# Patient Record
Sex: Male | Born: 1975
Health system: Southern US, Community
[De-identification: ages and names within clinical notes are randomized; demographics above are authoritative.]

## PROBLEM LIST (undated history)

## (undated) DIAGNOSIS — N200 Calculus of kidney: Secondary | ICD-10-CM

## (undated) HISTORY — PX: WISDOM TOOTH EXTRACTION: SHX21

## (undated) HISTORY — DX: Calculus of kidney: N20.0

## (undated) HISTORY — PX: ANKLE FRACTURE SURGERY: SHX122

## (undated) HISTORY — PX: MOUTH SURGERY: SHX715

---

## 2003-07-13 ENCOUNTER — Emergency Department (HOSPITAL_COMMUNITY): Admission: EM | Admit: 2003-07-13 | Discharge: 2003-07-13 | Payer: Self-pay | Admitting: Family Medicine

## 2003-11-15 ENCOUNTER — Encounter: Admission: RE | Admit: 2003-11-15 | Discharge: 2003-11-15 | Payer: Self-pay | Admitting: Gastroenterology

## 2003-12-16 ENCOUNTER — Emergency Department (HOSPITAL_COMMUNITY): Admission: EM | Admit: 2003-12-16 | Discharge: 2003-12-16 | Payer: Self-pay | Admitting: Family Medicine

## 2009-11-23 ENCOUNTER — Encounter: Admission: RE | Admit: 2009-11-23 | Discharge: 2009-11-23 | Payer: Self-pay | Admitting: Internal Medicine

## 2010-04-16 ENCOUNTER — Encounter: Payer: Self-pay | Admitting: Internal Medicine

## 2010-05-23 ENCOUNTER — Ambulatory Visit: Payer: 59 | Attending: Internal Medicine

## 2010-05-26 ENCOUNTER — Ambulatory Visit: Payer: 59 | Attending: Internal Medicine

## 2010-05-26 DIAGNOSIS — IMO0001 Reserved for inherently not codable concepts without codable children: Secondary | ICD-10-CM | POA: Insufficient documentation

## 2010-05-26 DIAGNOSIS — M542 Cervicalgia: Secondary | ICD-10-CM | POA: Insufficient documentation

## 2010-06-01 ENCOUNTER — Ambulatory Visit: Payer: 59

## 2010-06-02 ENCOUNTER — Ambulatory Visit: Payer: 59

## 2010-06-05 ENCOUNTER — Ambulatory Visit: Payer: 59

## 2010-06-07 ENCOUNTER — Ambulatory Visit: Payer: 59

## 2015-01-13 ENCOUNTER — Ambulatory Visit (INDEPENDENT_AMBULATORY_CARE_PROVIDER_SITE_OTHER): Payer: 59 | Admitting: Physician Assistant

## 2015-01-13 VITALS — BP 110/80 | HR 101 | Temp 98.4°F | Resp 18 | Ht 69.0 in | Wt 178.0 lb

## 2015-01-13 DIAGNOSIS — R062 Wheezing: Secondary | ICD-10-CM

## 2015-01-13 DIAGNOSIS — J019 Acute sinusitis, unspecified: Secondary | ICD-10-CM | POA: Diagnosis not present

## 2015-01-13 DIAGNOSIS — R059 Cough, unspecified: Secondary | ICD-10-CM

## 2015-01-13 DIAGNOSIS — R05 Cough: Secondary | ICD-10-CM | POA: Diagnosis not present

## 2015-01-13 MED ORDER — ALBUTEROL SULFATE (2.5 MG/3ML) 0.083% IN NEBU
2.5000 mg | INHALATION_SOLUTION | Freq: Once | RESPIRATORY_TRACT | Status: AC
  Administered 2015-01-13: 2.5 mg via RESPIRATORY_TRACT

## 2015-01-13 MED ORDER — PREDNISONE 20 MG PO TABS: ORAL_TABLET | ORAL | Status: DC

## 2015-01-13 MED ORDER — IPRATROPIUM BROMIDE 0.03 % NA SOLN: 2.0000 | Freq: Two times a day (BID) | NASAL | Status: DC

## 2015-01-13 MED ORDER — AMOXICILLIN-POT CLAVULANATE 875-125 MG PO TABS: 1.0000 | ORAL_TABLET | Freq: Two times a day (BID) | ORAL | Status: AC

## 2015-01-13 MED ORDER — IPRATROPIUM BROMIDE 0.02 % IN SOLN
0.5000 mg | Freq: Once | RESPIRATORY_TRACT | Status: AC
  Administered 2015-01-13: 0.5 mg via RESPIRATORY_TRACT

## 2015-01-13 MED ORDER — GUAIFENESIN ER 1200 MG PO TB12: 1.0000 | ORAL_TABLET | Freq: Two times a day (BID) | ORAL | Status: DC | PRN

## 2015-01-13 NOTE — Patient Instructions (Signed)
Get plenty of rest and drink at least 64 ounces of water daily. 

## 2015-01-13 NOTE — Progress Notes (Signed)
Subjective:   Patient ID: Jerry Rowland, male     DOB: 03-20-1976, 39 y.o.    MRN: [id number]  PCP: No primary care provider on file.  Chief Complaint  Patient presents with  . URI    cough, runny nose. productive cough with green phlegm. No chills or fever    HPI  Presents for evaluation of productive cough, congestion, and ear fullness x 3 weeks.   Every time patient takes a deep breath, feels like he needs to cough. Cough associated with green-yellow mucous, but no blood. Feels generally week, trouble sleeping at night d/t congestion and gets SOB easily. Wife and kids got sick about a month ago with similar symptoms, but no other recent sick contacts.   Received flu shot approx. 2-3 weeks ago at work.    Prior to Admission medications   Not on File     Allergies  Allergen Reactions  . Codeine Itching     There are no active problems to display for this patient.    History reviewed. No pertinent family history.   Social History   Social History  . Marital Status: Married    Spouse Name: N/A  . Number of Children: N/A  . Years of Education: N/A   Occupational History  . IT     [employer]   Social History Main Topics  . Smoking status: Never Smoker   . Smokeless tobacco: Not on file  . Alcohol Use: 0.0 oz/week    0 Standard drinks or equivalent per week  . Drug Use: No  . Sexual Activity: Not on file   Other Topics Concern  . Not on file   Social History Narrative   Lives with his wife and children.        Review of Systems Constitutional: Negative for fever, chills, activity change, appetite change and fatigue.  HENT: Positive for congestion, ear pain (fullness), postnasal drip, rhinorrhea (occasional) and sore throat. Negative for hearing loss.  Eyes: Negative.  Respiratory: Positive for cough, chest tightness, shortness of breath and wheezing.  Cardiovascular: Negative for chest pain and palpitations.  Gastrointestinal: Negative.    Musculoskeletal: Negative.  Allergic/Immunologic: Negative for environmental allergies.  Neurological: Positive for headaches (related to coughing). Negative for dizziness and light-headedness.        Objective:  Physical Exam  Constitutional: He is oriented to person, place, and time. Vital signs are normal. He appears well-developed and well-nourished. No distress.  BP 110/80 mmHg  Pulse 101  Temp(Src) 98.4 F (36.9 C) (Oral)  Resp 18  Ht  (1.753 m)  Wt 178 lb (80.74 kg)  BMI 26.27 kg/m2  SpO2 97%   HENT:  Head: Normocephalic and atraumatic.  Right Ear: Hearing, tympanic membrane, external ear and ear canal normal.  Left Ear: Hearing, tympanic membrane, external ear and ear canal normal.  Nose: Mucosal edema and rhinorrhea present.  No foreign bodies. Right sinus exhibits no maxillary sinus tenderness and no frontal sinus tenderness. Left sinus exhibits no maxillary sinus tenderness and no frontal sinus tenderness.  Mouth/Throat: Uvula is midline, oropharynx is clear and moist and mucous membranes are normal. No uvula swelling. No oropharyngeal exudate.  Eyes: Conjunctivae and EOM are normal. Pupils are equal, round, and reactive to light. Right eye exhibits no discharge. Left eye exhibits no discharge. No scleral icterus.  Neck: Trachea normal, normal range of motion and full passive range of motion without pain. Neck supple. No thyroid mass and no thyromegaly present.  Cardiovascular: Normal rate, regular rhythm and normal heart sounds.   Pulmonary/Chest: Effort normal. No respiratory distress. He has wheezes (coarse). He has no rales. He exhibits no tenderness.  Lymphadenopathy:       Head (right side): No submandibular, no tonsillar, no preauricular, no posterior auricular and no occipital adenopathy present.       Head (left side): No submandibular, no tonsillar, no preauricular and no occipital adenopathy present.    He has no cervical adenopathy.       Right: No  supraclavicular adenopathy present.       Left: No supraclavicular adenopathy present.  Neurological: He is alert and oriented to person, place, and time. He has normal strength. No cranial nerve deficit or sensory deficit.  Skin: Skin is warm, dry and intact. No rash noted.  Psychiatric: He has a normal mood and affect. His speech is normal and behavior is normal.    Albuterol + Atrovent neb treatment afforded minimal improvement in his symptoms, but did cause HA and jitteriness. Wheezing after neb was minimally reduced, but no longer coarse, very high-pitched and musical.         Assessment & Plan:  1. Cough 2. Wheezing Likely due to drainage from sinusitis. Prednisone. See below. - albuterol (PROVENTIL) (2.5 MG/3ML) 0.083% nebulizer solution 2.5 mg; Take 3 mLs (2.5 mg total) by nebulization once. - ipratropium (ATROVENT) nebulizer solution 0.5 mg; Take 2.5 mLs (0.5 mg total) by nebulization once. - predniSONE (DELTASONE) 20 MG tablet; Take 3 PO QAM x3days, 2 PO QAM x3days, 1 PO QAM x3days  Dispense: 18 tablet; Refill: 0  3. Subacute sinusitis, unspecified location Likely secondary to initial viral URI. Supportive care. Anticipatory guidance. Follow-up PRN. - amoxicillin-clavulanate (AUGMENTIN) 875-125 MG tablet; Take 1 tablet by mouth 2 (two) times daily.  Dispense: 20 tablet; Refill: 0 - ipratropium (ATROVENT) 0.03 % nasal spray; Place 2 sprays into both nostrils 2 (two) times daily.  Dispense: 30 mL; Refill: 0 - Guaifenesin (MUCINEX MAXIMUM STRENGTH) 1200 MG TB12; Take 1 tablet (1,200 mg total) by mouth every 12 (twelve) hours as needed.  Dispense: 14 tablet; Refill: 1   Fernande Brashelle S. Helen Cuff, PA-C Physician Assistant-Certified [facility]

## 2015-01-13 NOTE — Progress Notes (Signed)
Subjective:    Patient ID: Jerry Rowland, male    DOB: 1976-02-05, 39 y.o.   MRN: [id number]  Chief Complaint  Patient presents with  . URI    cough, runny nose. productive cough with green phlegm. No chills or fever   HPI Patient presents today for evaluation of productive cough, congestion, and ear fullness x 3 weeks. Every time patient takes a deep breath, feels like he needs to cough. Cough associated with green-yellow mucous, but no blood. Feels generally week, trouble sleeping at night d/t congestion and gets SOB easily. Wife and kids got sick about a month ago with similar symptoms, but no other recent sick contacts. Received flu shot approx. 2-3 weeks ago at work.   Review of Systems  Constitutional: Negative for fever, chills, activity change, appetite change and fatigue.  HENT: Positive for congestion, ear pain (fullness), postnasal drip, rhinorrhea (occasional) and sore throat. Negative for hearing loss.   Eyes: Negative.   Respiratory: Positive for cough, chest tightness, shortness of breath and wheezing.   Cardiovascular: Negative for chest pain and palpitations.  Gastrointestinal: Negative.   Musculoskeletal: Negative.   Allergic/Immunologic: Negative for environmental allergies.  Neurological: Positive for headaches (related to coughing). Negative for dizziness and light-headedness.   History reviewed. No pertinent family history.   Social History   Social History  . Marital Status: Married    Spouse Name: N/A  . Number of Children: N/A  . Years of Education: N/A   Occupational History  . Not on file.   Social History Main Topics  . Smoking status: Never Smoker   . Smokeless tobacco: Not on file  . Alcohol Use: 0.0 oz/week    0 Standard drinks or equivalent per week  . Drug Use: No  . Sexual Activity: Not on file   Other Topics Concern  . Not on file   Social History Narrative  . No narrative on file   No medications on file.   Allergies    Allergen Reactions  . Codeine Itching      Objective:   Physical Exam  Constitutional: He appears well-developed and well-nourished. He appears distressed (Patient standing up and pacing room, uncomfortable).  BP 110/80 mmHg  Pulse 101  Temp(Src) 98.4 F (36.9 C) (Oral)  Resp 18  Ht  (1.753 m)  Wt 178 lb (80.74 kg)  BMI 26.27 kg/m2  SpO2 97%  HENT:  Head: Normocephalic and atraumatic.  Eyes: Conjunctivae and EOM are normal. No scleral icterus.  Neck: Neck supple. No JVD present.  Cardiovascular: Normal rate, regular rhythm, normal heart sounds and intact distal pulses.  Exam reveals no gallop and no friction rub.   No murmur heard. Pulmonary/Chest: He has wheezes (coarse). He has no rales. He exhibits no tenderness.  Wet cough whenever he takes a deep breath  Lymphadenopathy:    He has no cervical adenopathy.   Post-nebulizer treatment (albuterol + ipratropium), repeated patient's lung exam, which revealed high-pitched, musical wheezes. Patient reports minimal improvement of symptoms with increased jitteriness and HA consistent with nebulizer treatment.      Assessment & Plan:  1. Cough - Likely d/t sinus etiology. Recommend Mucinex to thin secretions for easier clearing of mucous and Atrovent nasal spray to alleviate lung twitching, which is exacerbating patient's cough.    2. Wheezing - Likely d/t upper respiratory drainage. Do not suspect infection in the lungs. Offered patient nebulizer equipment for breathing treatments at home, however patient declined. States his wife has  one at home.  - albuterol (PROVENTIL) (2.5 MG/3ML) 0.083% nebulizer solution 2.5 mg; Take 3 mLs (2.5 mg total) by nebulization once. - ipratropium (ATROVENT) nebulizer solution 0.5 mg; Take 2.5 mLs (0.5 mg total) by nebulization once. - predniSONE (DELTASONE) 20 MG tablet; Take 3 PO QAM x3days, 2 PO QAM x3days, 1 PO QAM x3days  Dispense: 18 tablet; Refill: 0  3. Subacute sinusitis, unspecified  location - Given duration of patient's symptoms with associated productive cough and wheezing without consolidation, suspect sinus etiology. Encourage plenty of fluids and lots of rest while completing antibiotic therapy.  - amoxicillin-clavulanate (AUGMENTIN) 875-125 MG tablet; Take 1 tablet by mouth 2 (two) times daily.  Dispense: 20 tablet; Refill: 0 - ipratropium (ATROVENT) 0.03 % nasal spray; Place 2 sprays into both nostrils 2 (two) times daily.  Dispense: 30 mL; Refill: 0 - Guaifenesin (MUCINEX MAXIMUM STRENGTH) 1200 MG TB12; Take 1 tablet (1,200 mg total) by mouth every 12 (twelve) hours as needed.  Dispense: 14 tablet; Refill: 1

## 2015-11-28 ENCOUNTER — Emergency Department: Payer: 59

## 2015-11-28 ENCOUNTER — Emergency Department
Admission: EM | Admit: 2015-11-28 | Discharge: 2015-11-28 | Disposition: A | Discharge status: Home / Self Care | Payer: 59 | Attending: Emergency Medicine | Admitting: Emergency Medicine

## 2015-11-28 ENCOUNTER — Encounter: Payer: Self-pay | Admitting: *Deleted

## 2015-11-28 DIAGNOSIS — R1030 Lower abdominal pain, unspecified: Secondary | ICD-10-CM | POA: Diagnosis not present

## 2015-11-28 DIAGNOSIS — N132 Hydronephrosis with renal and ureteral calculous obstruction: Secondary | ICD-10-CM | POA: Diagnosis not present

## 2015-11-28 DIAGNOSIS — N200 Calculus of kidney: Secondary | ICD-10-CM | POA: Insufficient documentation

## 2015-11-28 DIAGNOSIS — R109 Unspecified abdominal pain: Secondary | ICD-10-CM

## 2015-11-28 DIAGNOSIS — N1330 Unspecified hydronephrosis: Secondary | ICD-10-CM | POA: Diagnosis not present

## 2015-11-28 DIAGNOSIS — R1031 Right lower quadrant pain: Secondary | ICD-10-CM | POA: Diagnosis present

## 2015-11-28 LAB — BASIC METABOLIC PANEL
Anion gap: 10 (ref 5–15)
BUN: 16 mg/dL (ref 6–20)
CALCIUM: 9.5 mg/dL (ref 8.9–10.3)
CO2: 25 mmol/L (ref 22–32)
CREATININE: 1.2 mg/dL (ref 0.61–1.24)
Chloride: 106 mmol/L (ref 101–111)
GFR calc non Af Amer: >60 mL/min (ref >60)
Glucose, Bld: 122 mg/dL — ABNORMAL HIGH (ref 65–99)
Potassium: 3.2 mmol/L — ABNORMAL LOW (ref 3.5–5.1)
Sodium: 141 mmol/L (ref 135–145)

## 2015-11-28 LAB — CBC WITH DIFFERENTIAL/PLATELET
BASOS PCT: 1 %
Basophils Absolute: 0.1 10*3/uL (ref 0–0.1)
EOS ABS: 0.1 10*3/uL (ref 0–0.7)
Eosinophils Relative: 1 %
HEMATOCRIT: 42.5 % (ref 40.0–52.0)
Hemoglobin: 15 g/dL (ref 13.0–18.0)
Lymphocytes Relative: 32 %
Lymphs Abs: 3.1 10*3/uL (ref 1.0–3.6)
MCH: 30.3 pg (ref 26.0–34.0)
MCHC: 35.3 g/dL (ref 32.0–36.0)
MCV: 85.6 fL (ref 80.0–100.0)
MONO ABS: 0.8 10*3/uL (ref 0.2–1.0)
MONOS PCT: 8 %
NEUTROS ABS: 5.6 10*3/uL (ref 1.4–6.5)
Neutrophils Relative %: 58 %
Platelets: 290 10*3/uL (ref 150–440)
RBC: 4.96 MIL/uL (ref 4.40–5.90)
RDW: 12.9 % (ref 11.5–14.5)
WBC: 9.7 10*3/uL (ref 3.8–10.6)

## 2015-11-28 LAB — URINALYSIS COMPLETE WITH MICROSCOPIC (ARMC ONLY)
BACTERIA UA: NONE SEEN
Bilirubin Urine: NEGATIVE
Glucose, UA: NEGATIVE mg/dL
Hgb urine dipstick: NEGATIVE
KETONES UR: NEGATIVE mg/dL
Leukocytes, UA: NEGATIVE
NITRITE: NEGATIVE
PH: 9 — AB (ref 5.0–8.0)
PROTEIN: 30 mg/dL — AB
Specific Gravity, Urine: 1.017 (ref 1.005–1.030)

## 2015-11-28 MED ORDER — SODIUM CHLORIDE 0.9 % IV BOLUS (SEPSIS): 1000.0000 mL | Freq: Once | INTRAVENOUS | Status: DC

## 2015-11-28 MED ORDER — TAMSULOSIN HCL 0.4 MG PO CAPS: 0.4000 mg | ORAL_CAPSULE | Freq: Every day | ORAL | 0 refills | Status: DC

## 2015-11-28 MED ORDER — DEXTROSE 5 % IV SOLN: 1.0000 g | Freq: Once | INTRAVENOUS | Status: DC

## 2015-11-28 MED ORDER — HYDROMORPHONE HCL 1 MG/ML IJ SOLN
1.0000 mg | Freq: Once | INTRAMUSCULAR | Status: AC
  Administered 2015-11-28: 1 mg via INTRAVENOUS

## 2015-11-28 MED ORDER — HYDROMORPHONE HCL 1 MG/ML IJ SOLN
INTRAMUSCULAR | Status: AC
  Administered 2015-11-28: 1 mg via INTRAVENOUS
  Filled 2015-11-28: qty 1

## 2015-11-28 MED ORDER — KETOROLAC TROMETHAMINE 30 MG/ML IJ SOLN
INTRAMUSCULAR | Status: AC
  Administered 2015-11-28: 30 mg via INTRAVENOUS
  Filled 2015-11-28: qty 1

## 2015-11-28 MED ORDER — KETOROLAC TROMETHAMINE 30 MG/ML IJ SOLN
30.0000 mg | Freq: Once | INTRAMUSCULAR | Status: AC
  Administered 2015-11-28: 30 mg via INTRAVENOUS

## 2015-11-28 MED ORDER — OXYCODONE-ACETAMINOPHEN 5-325 MG PO TABS: 1.0000 | ORAL_TABLET | Freq: Four times a day (QID) | ORAL | 0 refills | Status: DC | PRN

## 2015-11-28 MED ORDER — SODIUM CHLORIDE 0.9 % IV BOLUS (SEPSIS): 500.0000 mL | Freq: Once | INTRAVENOUS | Status: DC

## 2015-11-28 MED ORDER — SODIUM CHLORIDE 0.9 % IV BOLUS (SEPSIS)
1000.0000 mL | Freq: Once | INTRAVENOUS | Status: AC
  Administered 2015-11-28: 1000 mL via INTRAVENOUS

## 2015-11-28 MED ORDER — TAMSULOSIN HCL 0.4 MG PO CAPS
0.4000 mg | ORAL_CAPSULE | Freq: Once | ORAL | Status: AC
  Administered 2015-11-28: 0.4 mg via ORAL
  Filled 2015-11-28: qty 1

## 2015-11-28 NOTE — ED Triage Notes (Signed)
States right sided flank pain, pt unable to sit still, states nausea

## 2015-11-28 NOTE — ED Provider Notes (Signed)
[facility] Emergency Department Provider Note   ____________________________________________   First MD Initiated Contact with Patient 11/28/15 1826     (approximate)  I have reviewed the triage vital signs and the nursing notes.   HISTORY  Chief Complaint Flank Pain  HPI Jerry Rowland is a 40 y.o. male without any chronic medical conditions was presenting to the emergency department with sudden onset right lower flank pain that started 1 hour prior to arrival. He says the pain is severe at a 10 out of 10 and sharp. He says that feels like something is squeezing in his right lower flank and radiating down into his testicles. He has urinated and said it did not hurt to urinate. No history of kidney stones. No family history of kidney stones. He denies any nausea or vomiting.   History reviewed. No pertinent past medical history.  There are no active problems to display for this patient.   History reviewed. No pertinent surgical history.  Prior to Admission medications   Medication Sig Start Date End Date Taking? Authorizing Provider  Guaifenesin (MUCINEX MAXIMUM STRENGTH) 1200 MG TB12 Take 1 tablet (1,200 mg total) by mouth every 12 (twelve) hours as needed. 01/13/15   Chelle Jeffery, PA-C  ipratropium (ATROVENT) 0.03 % nasal spray Place 2 sprays into both nostrils 2 (two) times daily. 01/13/15   Chelle Jeffery, PA-C  predniSONE (DELTASONE) 20 MG tablet Take 3 PO QAM x3days, 2 PO QAM x3days, 1 PO QAM x3days 01/13/15   Chelle Jeffery, PA-C    Allergies Codeine  History reviewed. No pertinent family history.  Social History Social History  Substance Use Topics  . Smoking status: Never Smoker  . Smokeless tobacco: Not on file  . Alcohol use 0.0 oz/week    Review of Systems Constitutional: No fever/chills Eyes: No visual changes. ENT: No sore throat. Cardiovascular: Denies chest pain. Respiratory: Denies shortness of  breath. Gastrointestinal: Right lower abdominal pain.  No nausea, no vomiting.  No diarrhea.  No constipation. Genitourinary: Negative for dysuria. Musculoskeletal: As above Skin: Negative for rash. Neurological: Negative for headaches, focal weakness or numbness.  10-point ROS otherwise negative.  ____________________________________________   PHYSICAL EXAM:  VITAL SIGNS: ED Triage Vitals  Enc Vitals Group     BP --      Pulse Rate 11/28/15 1825 (!) 103     Resp --      Temp --      Temp src --      SpO2 11/28/15 1825 100 %     Weight 11/28/15 1811 179 lb (81.2 kg)     Height 11/28/15 1811 5\' 9"  (1.753 m)     Head Circumference --      Peak Flow --      Pain Score 11/28/15 1817 10     Pain Loc --      Pain Edu? --      Excl. in GC? --     Constitutional: Alert and oriented. Patient rolling on bed failing to find a position of comfort. Eyes: Conjunctivae are normal. PERRL. EOMI. Head: Atraumatic. Nose: No congestion/rhinnorhea. Mouth/Throat: Mucous membranes are moist.   Neck: No stridor.   Cardiovascular: Tachycardic, regular rhythm. Grossly normal heart sounds.   Respiratory: Normal respiratory effort.  No retractions. Lungs CTAB. Gastrointestinal: Soft and with mild right lower quadrant tenderness. No rebound or guarding. No distention.  Mild right-sided CVA tenderness  Genitourinary:  Normal external genitalia without any masses. No testicular tenderness nor swelling.  Musculoskeletal: No lower extremity tenderness nor edema.  No joint effusions. Neurologic:  Normal speech and language. No gross focal neurologic deficits are appreciated.  Skin:  Skin is warm, dry and intact. No rash noted. Psychiatric: Mood and affect are normal. Speech and behavior are normal.  ____________________________________________   LABS (all labs ordered are listed, but only abnormal results are displayed)  Labs Reviewed  BASIC METABOLIC PANEL - Abnormal; Notable for the following:        Result Value   Potassium 3.2 (*)    Glucose, Bld 122 (*)    All other components within normal limits  URINALYSIS COMPLETEWITH MICROSCOPIC ([facility] ONLY) - Abnormal; Notable for the following:    Color, Urine YELLOW (*)    APPearance CLOUDY (*)    pH 9.0 (*)    Protein, ur 30 (*)    Squamous Epithelial / LPF 0-5 (*)    All other components within normal limits  CBC WITH DIFFERENTIAL/PLATELET   ____________________________________________  EKG   ____________________________________________  RADIOLOGY  CT RENAL STONE STUDY (Accession [id number]) (Order [id number])  Imaging  Date: 11/28/2015 Department: [facility] EMERGENCY DEPARTMENT Released By/Authorizing: Myrna Blazer, MD (auto-released)  PACS Images   Show images for CT RENAL STONE STUDY  Study Result   CLINICAL DATA:  Right groin pain radiating into the right flank 1:30 p.m. today.  EXAM: CT ABDOMEN AND PELVIS WITHOUT CONTRAST  TECHNIQUE: Multidetector CT imaging of the abdomen and pelvis was performed following the standard protocol without IV contrast.  COMPARISON:  CT abdomen and pelvis 11/23/2009.  FINDINGS: No pleural or pericardial effusion. Heart size is normal. Lung bases are clear.  Mild to moderate right hydronephrosis due to a 0.3 cm stone at the UVJ. A punctate nonobstructing stone is identified in the lower pole of the right kidney. No urinary tract stones are seen. Urinary bladder, prostate gland and seminal vesicles appear normal.  The gallbladder, liver, spleen, adrenal glands and pancreas appear normal. The small and large bowel and appendix appear normal. No lymphadenopathy or fluid.  No focal bony abnormality.  IMPRESSION: Mild to moderate right hydronephrosis due to a 0.3 cm stone at the UVJ. Punctate nonobstructing stone lower pole right kidney also noted.   Electronically Signed   By: Drusilla Kanner M.D.   On: 11/28/2015 19:22     ____________________________________________   PROCEDURES  Procedure(s) performed:   Procedures  Critical Care performed:   ____________________________________________   INITIAL IMPRESSION / ASSESSMENT AND PLAN / ED COURSE  Pertinent labs & imaging results that were available during my care of the patient were reviewed by me and considered in my medical decision making (see chart for details).  History and physical exam consistent with kidney stone. The patient will be fluid hydrated as well as received Toradol. We will scan his abdomen as well as check blood work. He is understanding of the plan and willing to comply.  Clinical Course   ----------------------------------------- 7:46 PM on 11/28/2015 ----------------------------------------- Patient is pain-free at this time after Toradol and Dilaudid. He does not show any signs of infection in his urine. His blood work is reassuring as well with a normal white blood cell count as well as kidney function. His discussed his lab results as well as imaging. The patient will be discharged home on Flomax as well as Percocet for breakthrough pain. We also discussed return precautions including fever and abdominal pain. The patient is understanding of the plan and willing to comply. He'll be  given a strainer and also instructions for follow-up with urology.   ____________________________________________   FINAL CLINICAL IMPRESSION(S) / ED DIAGNOSES  Final diagnoses:  Right flank pain   Kidney stone.   NEW MEDICATIONS STARTED DURING THIS VISIT:  New Prescriptions   No medications on file     Note:  This document was prepared using Dragon voice recognition software and may include unintentional dictation errors.    Myrna Blazer, MD 11/28/15 [phone]

## 2015-12-13 ENCOUNTER — Encounter: Payer: Self-pay | Admitting: Urology

## 2015-12-13 ENCOUNTER — Ambulatory Visit (INDEPENDENT_AMBULATORY_CARE_PROVIDER_SITE_OTHER): Payer: 59 | Admitting: Urology

## 2015-12-13 VITALS — BP 147/90 | HR 100 | Ht 69.0 in | Wt 184.0 lb

## 2015-12-13 DIAGNOSIS — N200 Calculus of kidney: Secondary | ICD-10-CM | POA: Diagnosis not present

## 2015-12-13 LAB — URINALYSIS, COMPLETE
BILIRUBIN UA: NEGATIVE
GLUCOSE, UA: NEGATIVE
KETONES UA: NEGATIVE
Leukocytes, UA: NEGATIVE
NITRITE UA: NEGATIVE
Protein, UA: NEGATIVE
RBC UA: NEGATIVE
SPEC GRAV UA: 1.025 (ref 1.005–1.030)
Urobilinogen, Ur: 0.2 mg/dL (ref 0.2–1.0)
pH, UA: 5.5 (ref 5.0–7.5)

## 2015-12-13 LAB — MICROSCOPIC EXAMINATION: Bacteria, UA: NONE SEEN

## 2015-12-13 NOTE — Progress Notes (Signed)
12/13/2015 5:07 PM   Jerry Rowland [birth year] [id number]  Referring provider: No referring provider defined for this encounter.  Chief Complaint  Patient presents with  . Nephrolithiasis    New Patient    HPI: 40 year old male seen in follow-up from the emergency room where he presented  on 9/4 with acute onset right-sided renal colic pain. In the emergency room he was found to be nontoxic with no evidence of infection. A urinalysis demonstrated microscopic hematuria. A CT scan was performed demonstrating a 3 mm stone at the right UVJ. The patient was subsequently discharged on medical expulsion therapy. Since the patient was discharged from the emergency department he has passed a stone which he brought with him today. He denies any ongoing flank pain. He denies any fevers/chills or voiding symptoms. The patient has passed this stone as a child, he has not had any stones as an adult. The patient mentions that it is that he drinks a fair amount of soda/tea. He also has an excessive amount of protein.      PMH: Past Medical History:  Diagnosis Date  . Nephrolithiasis     Surgical History: Past Surgical History:  Procedure Laterality Date  . ANKLE FRACTURE SURGERY Right     Home Medications:    Medication List       Accurate as of 12/13/15  5:07 PM. Always use your most recent med list.          oxyCODONE-acetaminophen 5-325 MG tablet Commonly known as:  ROXICET Take 1-2 tablets by mouth every 6 (six) hours as needed.       Allergies:  Allergies  Allergen Reactions  . Codeine Itching    Family History: Family History  Problem Relation Age of Onset  . Prostate cancer Neg Hx   . Bladder Cancer Neg Hx   . Kidney cancer Neg Hx     Social History:  reports that he has never smoked. He has never used smokeless tobacco. He reports that he drinks alcohol. He reports that he does not use drugs.  ROS: UROLOGY Frequent Urination?: No Hard to postpone  urination?: No Burning/pain with urination?: No Get up at night to urinate?: No Leakage of urine?: No Urine stream starts and stops?: No Trouble starting stream?: No Do you have to strain to urinate?: No Blood in urine?: No Urinary tract infection?: No Sexually transmitted disease?: No Injury to kidneys or bladder?: No Painful intercourse?: No Weak stream?: No Erection problems?: No Penile pain?: No  Gastrointestinal Nausea?: No Vomiting?: No Indigestion/heartburn?: No Diarrhea?: No Constipation?: No  Constitutional Fever: No Night sweats?: No Weight loss?: No Fatigue?: Yes  Skin Skin rash/lesions?: No Itching?: No  Eyes Blurred vision?: No Double vision?: No  Ears/Nose/Throat Sore throat?: No Sinus problems?: No  Hematologic/Lymphatic Swollen glands?: No Easy bruising?: No  Cardiovascular Leg swelling?: No Chest pain?: No  Respiratory Cough?: No Shortness of breath?: No  Endocrine Excessive thirst?: No  Musculoskeletal Back pain?: No Joint pain?: No  Neurological Headaches?: No Dizziness?: No  Psychologic Depression?: No Anxiety?: No  Physical Exam: BP (!) 147/90   Pulse 100   Ht 5\' 9"  (1.753 m)   Wt 83.5 kg (184 lb)   BMI 27.17 kg/m   Constitutional:  Alert and oriented, No acute distress. HEENT: [place] AT, moist mucus membranes.  Trachea midline, no masses. Cardiovascular: No clubbing, cyanosis, or edema. Respiratory: Normal respiratory effort, no increased work of breathing. GI: Abdomen is soft, nontender, nondistended, no abdominal masses GU:  No CVA tenderness.  Skin: No rashes, bruises or suspicious lesions. Lymph: No cervical or inguinal adenopathy. Neurologic: Grossly intact, no focal deficits, moving all 4 extremities. Psychiatric: Normal mood and affect.  Laboratory Data: Lab Results  Component Value Date   WBC 9.7 11/28/2015   HGB 15.0 11/28/2015   HCT 42.5 11/28/2015   MCV 85.6 11/28/2015   PLT 290 11/28/2015     Lab Results  Component Value Date   CREATININE 1.20 11/28/2015    No results found for: PSA  No results found for: TESTOSTERONE  No results found for: HGBA1C  Urinalysis    Component Value Date/Time   COLORURINE YELLOW (A) 11/28/2015 1819   APPEARANCEUR Clear 12/13/2015 1518   LABSPEC 1.017 11/28/2015 1819   PHURINE 9.0 (H) 11/28/2015 1819   GLUCOSEU Negative 12/13/2015 1518   HGBUR NEGATIVE 11/28/2015 1819   BILIRUBINUR Negative 12/13/2015 1518   KETONESUR NEGATIVE 11/28/2015 1819   PROTEINUR Negative 12/13/2015 1518   PROTEINUR 30 (A) 11/28/2015 1819   NITRITE Negative 12/13/2015 1518   NITRITE NEGATIVE 11/28/2015 1819   LEUKOCYTESUR Negative 12/13/2015 1518    Pertinent Imaging: I have independently reviewed the patient's CT scan from 11/28/15 demonstrating a small stone at the right UVJ with associated mild/moderate hydronephrosis. There is a punctate calcification in the right kidney is nonobstructive.  Assessment  & Plan:  40 year old male who was passed his obstructing kidney stone and is no longer symptomatic. They're nonobstructing stone in his right kidney is of little clinical significance this point for surveillance. I went over stone prevention strategies patient including increasing fluid intake/ear output. Also suggest that he start lemonade therapy. We also discussed the importance of low salt diet, avoiding vitamin C, and minimizing his protein intake to 1 serving sizes/day. The patient will follow-up with us on A when necessary basis  1. Nephrolithiasis  - Urinalysis, Complete   No Follow-up on file.  Crist FatHERRICK, [name], MD  [facility] [address], [address] [place], [place] [address] [phone]

## 2016-10-26 ENCOUNTER — Telehealth: Payer: Self-pay | Admitting: General Practice

## 2016-10-26 NOTE — Telephone Encounter (Signed)
If someone new comes in requesting a physical, I will do it, but will not address separate concerns (like knee pain or depression, etc). If that is his only desire on 8/9, I am happy to comply. TY.

## 2016-10-26 NOTE — Telephone Encounter (Signed)
°  Relation to ZO:XWRUpt:self Call back number:[phone]   Reason for call:  Patient scheduled new patient appointment for 11/01/16 and would like physical conducted at this time, informed patient depending on patient medical history or concerns PCP may advise him to schedule physical at next appointment therefore it would be at PCP discrepancy, patient voice understanding.

## 2016-10-29 NOTE — Telephone Encounter (Signed)
Patient voice understanding

## 2016-11-01 ENCOUNTER — Ambulatory Visit (INDEPENDENT_AMBULATORY_CARE_PROVIDER_SITE_OTHER): Payer: 59 | Admitting: Family Medicine

## 2016-11-01 ENCOUNTER — Encounter: Payer: Self-pay | Admitting: Family Medicine

## 2016-11-01 VITALS — BP 130/82 | HR 97 | Temp 97.9°F | Ht 69.0 in | Wt 183.4 lb

## 2016-11-01 DIAGNOSIS — R0609 Other forms of dyspnea: Secondary | ICD-10-CM

## 2016-11-01 DIAGNOSIS — R1084 Generalized abdominal pain: Secondary | ICD-10-CM

## 2016-11-01 LAB — CBC
HCT: 45 % (ref 39.0–52.0)
Hemoglobin: 15.2 g/dL (ref 13.0–17.0)
MCHC: 33.9 g/dL (ref 30.0–36.0)
MCV: 89.3 fl (ref 78.0–100.0)
PLATELETS: 240 10*3/uL (ref 150.0–400.0)
RBC: 5.03 Mil/uL (ref 4.22–5.81)
RDW: 13.2 % (ref 11.5–15.5)
WBC: 5.2 10*3/uL (ref 4.0–10.5)

## 2016-11-01 LAB — COMPREHENSIVE METABOLIC PANEL
ALT: 21 U/L (ref 0–53)
AST: 15 U/L (ref 0–37)
Albumin: 4.7 g/dL (ref 3.5–5.2)
Alkaline Phosphatase: 70 U/L (ref 39–117)
BUN: 15 mg/dL (ref 6–23)
CALCIUM: 9.4 mg/dL (ref 8.4–10.5)
CHLORIDE: 103 meq/L (ref 96–112)
CO2: 31 meq/L (ref 19–32)
CREATININE: 1.01 mg/dL (ref 0.40–1.50)
GFR: 85.7 mL/min (ref >60.00)
GLUCOSE: 102 mg/dL — AB (ref 70–99)
Potassium: 4 mEq/L (ref 3.5–5.1)
Sodium: 138 mEq/L (ref 135–145)
Total Bilirubin: 0.6 mg/dL (ref 0.2–1.2)
Total Protein: 7.2 g/dL (ref 6.0–8.3)

## 2016-11-01 NOTE — Progress Notes (Signed)
Chief Complaint  Patient presents with  . Establish Care    pt want to discuss trouble breathing after climbing stairs       New Patient Visit SUBJECTIVE: HPI: Jerry Rowland is an 41 y.o.male who is being seen for establishing care.  For several years, the patient has noticed after going up 2-3 flights of stairs, he is somewhat out of breath. It takes him around 30 seconds to recover, and he continues to walking as he recovers. Jerry Rowland does not need to stop once he gets to the top of the stairs. If he goes up one flight of stairs, he does not have any issues. His legs are slightly sore as well. It does not take long from recovery from this either. He is physically active at home with yard work and playing with his children. He has smoked in the past during his college years. He was a social smoker. He does not currently smoke anything. No swelling, cough, fevers, hx of heart failure/lung disease, passing out, lightheadedness, dizziness, areas of easy bruising/bleeding.  His wife noticed something on his R testicle several years ago. He has not noticed anything. No changes. No urinary complaints.  He has a hx of chronic abd discomfort. He has BM's once every 5-7 days, but it is normal. No N/V, bleeding, weight loss, nighttime awakenings.    Allergies  Allergen Reactions  . Codeine Itching    Past Medical History:  Diagnosis Date  . Nephrolithiasis    Past Surgical History:  Procedure Laterality Date  . ANKLE FRACTURE SURGERY Right   . MOUTH SURGERY     Implants  . WISDOM TOOTH EXTRACTION     Social History   Social History  . Marital status: Married   Occupational History  . IT     [employer]   Social History Main Topics  . Smoking status: Never Smoker  . Smokeless tobacco: Never Used  . Alcohol use 0.0 oz/week  . Drug use: No   Social History Narrative   Lives with his wife and children.   Family History  Problem Relation Age of Onset  . Prostate cancer Neg Hx    . Bladder Cancer Neg Hx   . Kidney cancer Neg Hx    Takes no medications routinely.  ROS Cardiovascular: Denies chest pain  Respiratory: Denies current dyspnea   OBJECTIVE: BP 130/82 (BP Location: Left Arm, Patient Position: Sitting, Cuff Size: Normal)   Pulse 97   Temp 97.9 F (36.6 C) (Oral)   Ht 5\' 9"  (1.753 m)   Wt 183 lb 6.4 oz (83.2 kg)   SpO2 98%   BMI 27.08 kg/m   Constitutional: -  VS reviewed -  Well developed, well nourished, appears stated age -  No apparent distress  Psychiatric: -  Oriented to person, place, and time -  Memory intact -  Affect and mood normal -  Fluent conversation, good eye contact -  Judgment and insight age appropriate  Eye: -  Conjunctivae clear, no discharge -  Pupils symmetric, round, reactive to light  ENMT: -  MMM    Pharynx moist, no exudate, no erythema  Neck: -  No gross swelling, no palpable masses -  Thyroid midline, not enlarged, mobile, no palpable masses  Cardiovascular: -  RRR -  No LE edema  Respiratory: -  Normal respiratory effort, no accessory muscle use, no retraction -  Breath sounds equal, no wheezes, no ronchi, no crackles  Gastrointestinal: -  Bowel  sounds normal -  Diffuse discomfort to palpation, no distention, no guarding, no masses  GU: -  No lesions or nodules palpated -  The area that his wife pointed out was his epididymis. It is present on both sides. No TTP  Skin: -  No significant lesion on inspection -  Warm and dry to palpation   ASSESSMENT/PLAN: Dyspnea on exertion - Plan: CBC  Diffuse abdominal pain - Plan: Comprehensive metabolic panel  The patient never routinely saw a physician. We'll obtain some basic labs for the above. This sounds like physical deconditioning from lack of cardio. There is nothing in his history or exam makes me concerned for obstructive pulmonary disease. Given his age and lack of comorbidities, cardiac involvement is also less likely. I would like him to try some  cardiovascular exercise and see if that improves his baseline level of fitness and that affects his issue. Regarding abdominal issue, recommended he take some fiber supplements. Stay well-hydrated with water. Keep a symptom diary regarding which foods and beverages cause certain symptoms. It is not bothering him enough start a medicine. Could consider Elavil or anti-spasmodic as a trial.  Patient should return for CPE at earliest convenience. The patient voiced understanding and agreement to the plan.   Jilda Rocheicholas Paul [place], DO 11/01/16  12:14 PM

## 2016-11-01 NOTE — Patient Instructions (Addendum)
Let me know if you would like to try anything for your stomach.  For your breathing, I think this is normal. You could try to get more cardio in your life, I think this would be helpful. If things worsen, let me know.  If you feel a nodule on the testicle, let me know.  Give us 2-3 business days to get the results of your labs back. If labs are normal, you will likely receive a letter in the mail unless you have MyChart. This can take longer than 2-3 business days.   Let us know if you need anything.

## 2017-08-20 ENCOUNTER — Telehealth: Payer: Self-pay | Admitting: Family Medicine

## 2017-08-20 NOTE — Telephone Encounter (Signed)
Of course. Schedule him when convenient. TY.

## 2017-08-20 NOTE — Telephone Encounter (Signed)
Can you find him a time, thanks

## 2017-08-20 NOTE — Telephone Encounter (Signed)
Copied from CRM [id number]. Topic: Appointment Scheduling - Scheduling Inquiry for Clinic >> Aug 20, 2017 11:13 AM Debroah Loop wrote: Reason for CRM: Patient would like to know if Dr. Carmelia Roller will work him in for his CPE before July 1st for [facility] requirements? Please call back and advise.

## 2017-08-21 NOTE — Telephone Encounter (Signed)
Patient has appointment for 08-26-2017 @ 7:15am.

## 2017-08-26 ENCOUNTER — Ambulatory Visit (INDEPENDENT_AMBULATORY_CARE_PROVIDER_SITE_OTHER): Payer: 59 | Admitting: Family Medicine

## 2017-08-26 ENCOUNTER — Encounter: Payer: Self-pay | Admitting: Family Medicine

## 2017-08-26 VITALS — BP 110/74 | HR 87 | Temp 97.9°F | Ht 69.0 in | Wt 183.1 lb

## 2017-08-26 DIAGNOSIS — Z0000 Encounter for general adult medical examination without abnormal findings: Secondary | ICD-10-CM

## 2017-08-26 DIAGNOSIS — Z23 Encounter for immunization: Secondary | ICD-10-CM | POA: Diagnosis not present

## 2017-08-26 LAB — COMPREHENSIVE METABOLIC PANEL
ALBUMIN: 4.3 g/dL (ref 3.5–5.2)
ALK PHOS: 66 U/L (ref 39–117)
ALT: 20 U/L (ref 0–53)
AST: 17 U/L (ref 0–37)
BILIRUBIN TOTAL: 0.4 mg/dL (ref 0.2–1.2)
BUN: 19 mg/dL (ref 6–23)
CO2: 30 mEq/L (ref 19–32)
CREATININE: 0.94 mg/dL (ref 0.40–1.50)
Calcium: 9.3 mg/dL (ref 8.4–10.5)
Chloride: 105 mEq/L (ref 96–112)
GFR: 92.75 mL/min (ref >60.00)
GLUCOSE: 116 mg/dL — AB (ref 70–99)
POTASSIUM: 4.2 meq/L (ref 3.5–5.1)
SODIUM: 142 meq/L (ref 135–145)
TOTAL PROTEIN: 6.5 g/dL (ref 6.0–8.3)

## 2017-08-26 LAB — LIPID PANEL
Cholesterol: 207 mg/dL — ABNORMAL HIGH (ref 0–200)
HDL: 39.3 mg/dL (ref >39.00)
LDL Cholesterol: 138 mg/dL — ABNORMAL HIGH (ref 0–99)
NONHDL: 167.22
Total CHOL/HDL Ratio: 5
Triglycerides: 145 mg/dL (ref 0.0–149.0)
VLDL: 29 mg/dL (ref 0.0–40.0)

## 2017-08-26 NOTE — Patient Instructions (Addendum)
Keep up the good work.  Consider readers.  1-2 days to get results of your labs back.  Let us know if you need anything.

## 2017-08-26 NOTE — Progress Notes (Signed)
Pre visit review using our clinic review tool, if applicable. No additional management support is needed unless otherwise documented below in the visit note. 

## 2017-08-26 NOTE — Progress Notes (Signed)
Chief Complaint  Patient presents with  . Annual Exam    Well Male Jerry Rowland is here for a complete physical.   His last physical was >1 year ago.  Current diet: in general, a "healthy" diet.   Current exercise: Physically active at work and home Weight trend: stable Does pt snore? Yes, unsure about apneic episodes Daytime fatigue? Sometimes Seat belt? Yes.    Health maintenance Tetanus- No HIV- Yes - 08/26/2016 Prostate CA screening- No  Past Medical History:  Diagnosis Date  . Nephrolithiasis      Past Surgical History:  Procedure Laterality Date  . ANKLE FRACTURE SURGERY Right   . MOUTH SURGERY     Implants  . WISDOM TOOTH EXTRACTION      Medications  Takes no meds routinely  Allergies Allergies  Allergen Reactions  . Codeine Itching   Family History Family History  Problem Relation Age of Onset  . Prostate cancer Neg Hx   . Bladder Cancer Neg Hx   . Kidney cancer Neg Hx     Review of Systems: Constitutional: no fevers or chills Eye:  +losing near sightedness Ear/Nose/Mouth/Throat:  Ears:  no tinnitus or hearing loss Nose/Mouth/Throat:  no complaints of nasal congestion, no sore throat Cardiovascular:  no chest pain, no palpitations Respiratory:  no cough and no shortness of breath Gastrointestinal:  no abdominal pain, no change in bowel habits GU:  Male: negative for dysuria, frequency, and incontinence and negative for prostate symptoms Musculoskeletal/Extremities: +Chronic neck pain; no pain, redness, or swelling of the joints Integumentary (Skin/Breast):  no abnormal skin lesions reported Neurologic:  no headaches, no numbness, tingling Endocrine: No unexpected weight changes Hematologic/Lymphatic:  no night sweats  Exam BP 110/74 (BP Location: Left Arm, Patient Position: Sitting, Cuff Size: Normal)   Pulse 87   Temp 97.9 F (36.6 C) (Oral)   Ht 5\' 9"  (1.753 m)   Wt 183 lb 2 oz (83.1 kg)   SpO2 96%   BMI 27.04 kg/m  General:  well  developed, well nourished, in no apparent distress Skin:  no significant moles, warts, or growths Head:  no masses, lesions, or tenderness Eyes:  pupils equal and round, sclera anicteric without injection Ears:  canals without lesions, TMs shiny without retraction, no obvious effusion, no erythema Nose:  nares patent, septum midline, mucosa normal Throat/Pharynx:  lips and gingiva without lesion; tongue and uvula midline; non-inflamed pharynx; no exudates or postnasal drainage Neck: neck supple without adenopathy, thyromegaly, or masses Lungs:  clear to auscultation, breath sounds equal bilaterally, no respiratory distress Cardio:  regular rate and rhythm, no bruits, no LE edema Abdomen:  abdomen soft, nontender; bowel sounds normal; no masses or organomegaly Genital (male): Nml penis, no lesions or discharge; testes present bilaterally without masses or tenderness Rectal: Deferred Musculoskeletal:  symmetrical muscle groups noted without atrophy or deformity Extremities:  no clubbing, cyanosis, or edema, no deformities, no skin discoloration Neuro:  gait normal; deep tendon reflexes normal and symmetric Psych: well oriented with normal range of affect and appropriate judgment/insight  Assessment and Plan  Well adult exam - Plan: Comprehensive metabolic panel, Lipid panel   Well 42 y.o. male. Counseled on diet and exercise. Counseled on risks and benefits of PSA screening, he agreed to forego screening for now. Other orders as above. Follow up in 1 year pending the above workup. The patient voiced understanding and agreement to the plan.  [name], DO 08/26/17 7:33 AM

## 2017-11-14 DIAGNOSIS — H52223 Regular astigmatism, bilateral: Secondary | ICD-10-CM | POA: Diagnosis not present

## 2017-11-14 DIAGNOSIS — H524 Presbyopia: Secondary | ICD-10-CM | POA: Diagnosis not present

## 2018-10-28 ENCOUNTER — Other Ambulatory Visit: Payer: Self-pay

## 2018-10-28 ENCOUNTER — Encounter: Payer: Self-pay | Admitting: Family Medicine

## 2018-10-28 ENCOUNTER — Ambulatory Visit (INDEPENDENT_AMBULATORY_CARE_PROVIDER_SITE_OTHER): Payer: 59 | Admitting: Family Medicine

## 2018-10-28 ENCOUNTER — Other Ambulatory Visit (INDEPENDENT_AMBULATORY_CARE_PROVIDER_SITE_OTHER): Payer: 59

## 2018-10-28 VITALS — BP 106/72 | HR 107 | Temp 98.0°F | Ht 69.0 in | Wt 180.0 lb

## 2018-10-28 DIAGNOSIS — Z0000 Encounter for general adult medical examination without abnormal findings: Secondary | ICD-10-CM

## 2018-10-28 DIAGNOSIS — R739 Hyperglycemia, unspecified: Secondary | ICD-10-CM

## 2018-10-28 LAB — CBC
HCT: 43.7 % (ref 39.0–52.0)
Hemoglobin: 15 g/dL (ref 13.0–17.0)
MCHC: 34.3 g/dL (ref 30.0–36.0)
MCV: 87.8 fl (ref 78.0–100.0)
Platelets: 226 10*3/uL (ref 150.0–400.0)
RBC: 4.97 Mil/uL (ref 4.22–5.81)
RDW: 12.8 % (ref 11.5–15.5)
WBC: 5.1 10*3/uL (ref 4.0–10.5)

## 2018-10-28 LAB — COMPREHENSIVE METABOLIC PANEL
ALT: 18 U/L (ref 0–53)
AST: 14 U/L (ref 0–37)
Albumin: 4.3 g/dL (ref 3.5–5.2)
Alkaline Phosphatase: 65 U/L (ref 39–117)
BUN: 15 mg/dL (ref 6–23)
CO2: 29 mEq/L (ref 19–32)
Calcium: 9.3 mg/dL (ref 8.4–10.5)
Chloride: 104 mEq/L (ref 96–112)
Creatinine, Ser: 0.99 mg/dL (ref 0.40–1.50)
GFR: 81.76 mL/min (ref >60.00)
Glucose, Bld: 112 mg/dL — ABNORMAL HIGH (ref 70–99)
Potassium: 3.9 mEq/L (ref 3.5–5.1)
Sodium: 139 mEq/L (ref 135–145)
Total Bilirubin: 0.5 mg/dL (ref 0.2–1.2)
Total Protein: 6.3 g/dL (ref 6.0–8.3)

## 2018-10-28 LAB — LIPID PANEL
Cholesterol: 207 mg/dL — ABNORMAL HIGH (ref 0–200)
HDL: 40.7 mg/dL (ref >39.00)
LDL Cholesterol: 144 mg/dL — ABNORMAL HIGH (ref 0–99)
NonHDL: 166.33
Total CHOL/HDL Ratio: 5
Triglycerides: 112 mg/dL (ref 0.0–149.0)
VLDL: 22.4 mg/dL (ref 0.0–40.0)

## 2018-10-28 LAB — HEMOGLOBIN A1C: Hgb A1c MFr Bld: 5 % (ref 4.6–6.5)

## 2018-10-28 NOTE — Patient Instructions (Signed)
Give us 2-3 business days to get the results of your labs back.   Keep the diet clean and stay active.  Let us know if you need anything. 

## 2018-10-28 NOTE — Progress Notes (Signed)
Chief Complaint  Patient presents with  . Annual Exam    Well Male Jerry Rowland is here for a complete physical.   His last physical was >1 year ago.  Current diet: in general, a "healthy" diet.   Current exercise: active at work,  Weight trend: stable Daytime fatigue? No. Seat belt? Yes.    Health maintenance Tetanus- Yes HIV- Yes  Past Medical History:  Diagnosis Date  . Nephrolithiasis      Past Surgical History:  Procedure Laterality Date  . ANKLE FRACTURE SURGERY Right   . MOUTH SURGERY     Implants  . WISDOM TOOTH EXTRACTION      Medications  Takes no meds routinely.   Allergies Allergies  Allergen Reactions  . Codeine Itching   Family History Family History  Problem Relation Age of Onset  . Prostate cancer Neg Hx   . Bladder Cancer Neg Hx   . Kidney cancer Neg Hx    Review of Systems: Constitutional: no fevers or chills Eye:  no recent significant change in vision Ear/Nose/Mouth/Throat:  Ears:  no tinnitus or hearing loss Nose/Mouth/Throat:  no complaints of nasal congestion, no sore throat Cardiovascular:  no chest pain, no palpitations Respiratory:  no cough and no shortness of breath Gastrointestinal:  no abdominal pain, no change in bowel habits GU:  Male: negative for dysuria, frequency, and incontinence and negative for prostate symptoms Musculoskeletal/Extremities:  no pain, redness, or swelling of the joints Integumentary (Skin/Breast):  no abnormal skin lesions reported Neurologic:  no headaches, no numbness, tingling Endocrine: No unexpected weight changes Hematologic/Lymphatic:  no night sweats  Exam BP 106/72 (BP Location: Left Arm, Patient Position: Sitting, Cuff Size: Normal)   Pulse (!) 107   Temp 98 F (36.7 C) (Oral)   Ht 5\' 9"  (1.753 m)   Wt 180 lb (81.6 kg)   SpO2 97%   BMI 26.58 kg/m  General:  well developed, well nourished, in no apparent distress Skin:  no significant moles, warts, or growths Head:  no masses,  lesions, or tenderness Eyes:  pupils equal and round, sclera anicteric without injection Ears:  canals without lesions, TMs shiny without retraction, no obvious effusion, no erythema Nose:  nares patent, septum midline, mucosa normal Throat/Pharynx:  lips and gingiva without lesion; tongue and uvula midline; non-inflamed pharynx; no exudates or postnasal drainage Neck: neck supple without adenopathy, thyromegaly, or masses Lungs:  clear to auscultation, breath sounds equal bilaterally, no respiratory distress Cardio:  regular rate and rhythm, no bruits, no LE edema Abdomen:  abdomen soft, nontender; bowel sounds normal; no masses or organomegaly Rectal: Deferred Musculoskeletal:  symmetrical muscle groups noted without atrophy or deformity Extremities:  no clubbing, cyanosis, or edema, no deformities, no skin discoloration Neuro:  gait normal; deep tendon reflexes normal and symmetric Psych: well oriented with normal range of affect and appropriate judgment/insight  Assessment and Plan  Well adult exam - Plan: CBC, Comprehensive metabolic panel, Lipid panel  Well 43 y.o. male. Counseled on diet and exercise. Other orders as above. Follow up in 1 year pending the above workup. The patient voiced understanding and agreement to the plan.  [place], DO 10/28/18 7:18 AM

## 2019-08-06 ENCOUNTER — Emergency Department: Payer: 59

## 2019-08-06 ENCOUNTER — Other Ambulatory Visit: Payer: Self-pay

## 2019-08-06 DIAGNOSIS — Y999 Unspecified external cause status: Secondary | ICD-10-CM | POA: Insufficient documentation

## 2019-08-06 DIAGNOSIS — S6992XA Unspecified injury of left wrist, hand and finger(s), initial encounter: Secondary | ICD-10-CM | POA: Diagnosis not present

## 2019-08-06 DIAGNOSIS — S61215A Laceration without foreign body of left ring finger without damage to nail, initial encounter: Secondary | ICD-10-CM | POA: Diagnosis not present

## 2019-08-06 DIAGNOSIS — W298XXA Contact with other powered powered hand tools and household machinery, initial encounter: Secondary | ICD-10-CM | POA: Insufficient documentation

## 2019-08-06 DIAGNOSIS — Y929 Unspecified place or not applicable: Secondary | ICD-10-CM | POA: Insufficient documentation

## 2019-08-06 DIAGNOSIS — Y939 Activity, unspecified: Secondary | ICD-10-CM | POA: Insufficient documentation

## 2019-08-06 NOTE — ED Triage Notes (Signed)
Patient c/o ring finger left hand with a grinder. 1" laceration to distal finger.

## 2019-08-07 ENCOUNTER — Emergency Department
Admission: EM | Admit: 2019-08-07 | Discharge: 2019-08-07 | Disposition: A | Discharge status: Home / Self Care | Payer: 59 | Attending: Emergency Medicine | Admitting: Emergency Medicine

## 2019-08-07 DIAGNOSIS — S61215A Laceration without foreign body of left ring finger without damage to nail, initial encounter: Secondary | ICD-10-CM

## 2019-08-07 MED ORDER — LIDOCAINE HCL (PF) 1 % IJ SOLN
INTRAMUSCULAR | Status: AC
  Filled 2019-08-07: qty 5

## 2019-08-07 MED ORDER — CEPHALEXIN 500 MG PO CAPS
500.0000 mg | ORAL_CAPSULE | Freq: Once | ORAL | Status: AC
  Administered 2019-08-07: 500 mg via ORAL
  Filled 2019-08-07: qty 1

## 2019-08-07 MED ORDER — CEPHALEXIN 500 MG PO CAPS: 500.0000 mg | ORAL_CAPSULE | Freq: Three times a day (TID) | ORAL | 0 refills | Status: AC

## 2019-08-07 MED ORDER — IBUPROFEN 400 MG PO TABS
600.0000 mg | ORAL_TABLET | Freq: Once | ORAL | Status: AC
Start: 2019-08-07 — End: 2019-08-07
  Administered 2019-08-07: 600 mg via ORAL

## 2019-08-07 NOTE — Discharge Instructions (Addendum)
Keep laceration dry and clean. Wash with warm water and soap. Apply topical bacitracin. Protect from the sun to minimize scarring. You received 4 stitches that must be removed in 7 days.  Watch for signs of infection: pus, redness of the skin surrounding it, or fever. If these develop see your doctor or return to the ER for antibiotics.

## 2019-08-07 NOTE — ED Provider Notes (Signed)
[facility] Emergency Department Provider Note  ____________________________________________  Time seen: Approximately 2:06 AM  I have reviewed the triage vital signs and the nursing notes.   HISTORY  Chief Complaint Laceration   HPI Jerry Rowland is a 44 y.o. male right-handed dominant who presents for evaluation of a laceration to his left ring finger that happened just prior to arrival.  Patient cut the distal finger on a grinder.  Last tetanus shot 2019.  He is complaining of mild throbbing constant pain since it happened.  Denies any other injuries.  Laceration is in the palmar aspect with no nail involvement.   Past Medical History:  Diagnosis Date  . Nephrolithiasis     There are no problems to display for this patient.   Past Surgical History:  Procedure Laterality Date  . ANKLE FRACTURE SURGERY Right   . MOUTH SURGERY     Implants  . WISDOM TOOTH EXTRACTION      Prior to Admission medications   Medication Sig Start Date End Date Taking? Authorizing Provider  cephALEXin (KEFLEX) 500 MG capsule Take 1 capsule (500 mg total) by mouth 3 (three) times daily for 7 days. 08/07/19 08/14/19  Rudene Re, MD    Allergies Codeine  Family History  Problem Relation Age of Onset  . Prostate cancer Neg Hx   . Bladder Cancer Neg Hx   . Kidney cancer Neg Hx     Social History Social History   Tobacco Use  . Smoking status: Never Smoker  . Smokeless tobacco: Never Used  Substance Use Topics  . Alcohol use: Yes    Alcohol/week: 0.0 standard drinks  . Drug use: No    Review of Systems  Constitutional: Negative for fever. Eyes: Negative for visual changes. ENT: Negative for sore throat. Neck: No neck pain  Cardiovascular: Negative for chest pain. Respiratory: Negative for shortness of breath. Gastrointestinal: Negative for abdominal pain, vomiting or diarrhea. Genitourinary: Negative for dysuria. Musculoskeletal: Negative  for back pain. + finger laceration Skin: Negative for rash. Neurological: Negative for headaches, weakness or numbness. Psych: No SI or HI  ____________________________________________   PHYSICAL EXAM:  VITAL SIGNS: ED Triage Vitals  Enc Vitals Group     BP 08/06/19 2030 (!) 155/100     Pulse Rate 08/06/19 2030 (!) 110     Resp 08/06/19 2030 18     Temp 08/06/19 2030 98.4 F (36.9 C)     Temp src --      SpO2 08/06/19 2030 100 %     Weight 08/06/19 2031 179 lb 14.3 oz (81.6 kg)     Height 08/06/19 2031 5\' 9"  (1.753 m)     Head Circumference --      Peak Flow --      Pain Score 08/06/19 2030 2     Pain Loc --      Pain Edu? --      Excl. in [place]? --     Constitutional: Alert and oriented. Well appearing and in no apparent distress. HEENT:      Head: Normocephalic and atraumatic.         Eyes: Conjunctivae are normal. Sclera is non-icteric.       Mouth/Throat: Mucous membranes are moist.       Neck: Supple with no signs of meningismus. Cardiovascular: Regular rate and rhythm.  Respiratory: Normal respiratory effort. Musculoskeletal: 2 cm Laceration to the palmar aspect of the left ring finger overlying the distal phalange with no  tendon involvement or foreign body. Nail is intact  neurologic: Normal speech and language. Face is symmetric. Moving all extremities. No gross focal neurologic deficits are appreciated. Skin: Skin is warm, dry and intact. No rash noted. Psychiatric: Mood and affect are normal. Speech and behavior are normal.  ____________________________________________   LABS (all labs ordered are listed, but only abnormal results are displayed)  Labs Reviewed - No data to display ____________________________________________  EKG  none  ____________________________________________  RADIOLOGY  I have personally reviewed the images performed during this visit and I agree with the Radiologist's read.   Interpretation by Radiologist:  DG Hand Complete  Left  Result Date: 08/06/2019 CLINICAL DATA:  Left hand injury EXAM: LEFT HAND - COMPLETE 3+ VIEW COMPARISON:  None. FINDINGS: There is no evidence of fracture or dislocation. There is no evidence of arthropathy or other focal bone abnormality. There is a soft tissue laceration with soft tissue swelling seen overlying the second digit. No radiopaque foreign body. IMPRESSION: Negative. Electronically Signed   By: Jonna Clark M.D.   On: 08/06/2019 21:27     ____________________________________________   PROCEDURES  Procedure(s) performed:yes .Marland KitchenLaceration Repair  Date/Time: 08/07/2019 2:08 AM Performed by: Nita Sickle, MD Authorized by: Nita Sickle, MD   Consent:    Consent obtained:  Verbal   Consent given by:  Patient   Risks discussed:  Infection, pain, retained foreign body, poor cosmetic result, poor wound healing and nerve damage   Alternatives discussed:  Delayed treatment Anesthesia (see MAR for exact dosages):    Anesthesia method:  Nerve block   Block location:  Digit block   Block needle gauge:  25 G   Block anesthetic:  Lidocaine 1% w/o epi   Block injection procedure:  Anatomic landmarks identified, negative aspiration for blood and incremental injection   Block outcome:  Incomplete block Laceration details:    Location:  Finger   Finger location:  L ring finger   Length (cm):  2 Repair type:    Repair type:  Simple Pre-procedure details:    Preparation:  Imaging obtained to evaluate for foreign bodies and patient was prepped and draped in usual sterile fashion Exploration:    Hemostasis achieved with:  Direct pressure   Wound exploration: entire depth of wound probed and visualized     Wound extent: no foreign bodies/material noted, no tendon damage noted and no underlying fracture noted     Contaminated: no   Treatment:    Area cleansed with:  Saline and Betadine   Amount of cleaning:  Extensive   Irrigation solution:  Sterile saline    Visualized foreign bodies/material removed: no   Skin repair:    Repair method:  Sutures   Suture size:  5-0   Suture material:  Nylon   Suture technique:  Simple interrupted   Number of sutures:  4 Approximation:    Approximation:  Close Post-procedure details:    Dressing:  Sterile dressing   Patient tolerance of procedure:  Tolerated well, no immediate complications   Critical Care performed:  None ____________________________________________   INITIAL IMPRESSION / ASSESSMENT AND PLAN / ED COURSE  44 y.o. male right-handed dominant who presents for evaluation of a laceration to his left ring finger that happened just prior to arrival.  Laceration repaired per procedure note above.  Tetanus shot up-to-date.  Patient was placed on Keflex.  Discussed wound care follow-up with primary care doctor.  X-ray visualized by me showing no evidence of fracture or foreign body.  Old medical records reviewed.       _____________________________________________ Please note:  Patient was evaluated in Emergency Department today for the symptoms described in the history of present illness. Patient was evaluated in the context of the global COVID-19 pandemic, which necessitated consideration that the patient might be at risk for infection with the SARS-CoV-2 virus that causes COVID-19. Institutional protocols and algorithms that pertain to the evaluation of patients at risk for COVID-19 are in a state of rapid change based on information released by regulatory bodies including the CDC and federal and state organizations. These policies and algorithms were followed during the patient's care in the ED.  Some ED evaluations and interventions may be delayed as a result of limited staffing during the pandemic.   [place] Controlled Substance Database was reviewed by me. ____________________________________________   FINAL CLINICAL IMPRESSION(S) / ED DIAGNOSES   Final diagnoses:  Laceration of left ring  finger without foreign body without damage to nail, initial encounter      NEW MEDICATIONS STARTED DURING THIS VISIT:  ED Discharge Orders         Ordered    cephALEXin (KEFLEX) 500 MG capsule  3 times daily     08/07/19 0050           Note:  This document was prepared using Dragon voice recognition software and may include unintentional dictation errors.    Don Perking, [place], MD 08/07/19 [phone]

## 2019-08-07 NOTE — ED Notes (Signed)
ED Provider at bedside. 

## 2019-08-07 NOTE — ED Notes (Signed)
Reviewed discharge instructions, follow-up care, laceration care, and prescriptions with patient. Patient verbalized understanding of all information reviewed. Patient stable, with no distress noted at this time.    

## 2019-10-13 ENCOUNTER — Ambulatory Visit
Admission: EM | Admit: 2019-10-13 | Discharge: 2019-10-13 | Disposition: A | Discharge status: Home / Self Care | Payer: 59 | Attending: Family Medicine | Admitting: Family Medicine

## 2019-10-13 ENCOUNTER — Other Ambulatory Visit: Payer: Self-pay

## 2019-10-13 DIAGNOSIS — S61313A Laceration without foreign body of left middle finger with damage to nail, initial encounter: Secondary | ICD-10-CM | POA: Diagnosis not present

## 2019-10-13 DIAGNOSIS — M79645 Pain in left finger(s): Secondary | ICD-10-CM | POA: Diagnosis not present

## 2019-10-13 MED ORDER — CEPHALEXIN 500 MG PO CAPS: 500.0000 mg | ORAL_CAPSULE | Freq: Two times a day (BID) | ORAL | 0 refills | Status: AC

## 2019-10-13 NOTE — Discharge Instructions (Addendum)
Instructed to follow-up in 7 to 10 days for suture removal  Instructed to follow-up if he notices any redness, swelling, heat tenderness to the area, any drainage from the area  Sent in Keflex to pharmacy in case of infection  Follow-up with the ER for any streaking up the arm, high fever, other concerning symptoms

## 2019-10-13 NOTE — ED Triage Notes (Signed)
Pt presents to UC for laceration to left middle finger. Pt states injury occurred from during splitting wood, finger got caught in between wood and wedge splitter. Pt endorses minimal pain at site. Bleeding controlled with bandage at this time.

## 2019-10-13 NOTE — ED Provider Notes (Signed)
Jerry Rowland    CSN: [id number] Arrival date & time: 10/13/19  1443      History   Chief Complaint Chief Complaint  Patient presents with  . Extremity Laceration    Jerry Rowland is a 44 y.o. male.   Reports that he was cutting wood earlier, and got his finger caught in between a wedge and piece of wood and that he cut it.  Reports that he works with wood very often and has cuts on the ends of his fingers.  He reports that he cleaned it with water and let it bleed for a little bit to make sure that there were no foreign bodies in it.  Did not take any medications for this.  Did not attempt any other treatment.  Did have the area wrapped in a paper towel when he came in the office.  Last tetanus was about 2 months ago in the ER.  Denies any other symptoms.  ROS per Jerry  The history is provided by the patient.    Past Medical History:  Diagnosis Date  . Nephrolithiasis     There are no problems to display for this patient.   Past Surgical History:  Procedure Laterality Date  . ANKLE FRACTURE SURGERY Right   . MOUTH SURGERY     Implants  . WISDOM TOOTH EXTRACTION         Home Medications    Prior to Admission medications   Medication Sig Start Date End Date Taking? Authorizing Provider  cephALEXin (KEFLEX) 500 MG capsule Take 1 capsule (500 mg total) by mouth 2 (two) times daily for 5 days. 10/13/19 10/18/19  Moshe Cipro, NP    Family History Family History  Problem Relation Age of Onset  . Prostate cancer Neg Hx   . Bladder Cancer Neg Hx   . Kidney cancer Neg Hx     Social History Social History   Tobacco Use  . Smoking status: Never Smoker  . Smokeless tobacco: Never Used  Vaping Use  . Vaping Use: Never used  Substance Use Topics  . Alcohol use: Yes    Alcohol/week: 0.0 standard drinks  . Drug use: No     Allergies   Codeine   Review of Systems Review of Systems   Physical Exam Triage Vital Signs ED Triage  Vitals  Enc Vitals Group     BP 10/13/19 1448 (!) 137/91     Pulse Rate 10/13/19 1448 (!) 111     Resp 10/13/19 1448 16     Temp 10/13/19 1448 98.2 F (36.8 C)     Temp Source 10/13/19 1448 Oral     SpO2 10/13/19 1448 97 %     Weight --      Height --      Head Circumference --      Peak Flow --      Pain Score 10/13/19 1446 3     Pain Loc --      Pain Edu? --      Excl. in GC? --    No data found.  Updated Vital Signs BP (!) 137/91 (BP Location: Right Arm)   Pulse (!) 111   Temp 98.2 F (36.8 C) (Oral)   Resp 16   SpO2 97%   Visual Acuity Right Eye Distance:   Left Eye Distance:   Bilateral Distance:    Right Eye Near:   Left Eye Near:    Bilateral Near:  Physical Exam Vitals and nursing note reviewed.  Constitutional:      Appearance: Normal appearance. He is well-developed and normal weight. He is not ill-appearing.  HENT:     Head: Normocephalic and atraumatic.  Eyes:     Conjunctiva/sclera: Conjunctivae normal.  Cardiovascular:     Rate and Rhythm: Normal rate and regular rhythm.     Heart sounds: No murmur heard.   Pulmonary:     Effort: Pulmonary effort is normal. No respiratory distress.     Breath sounds: Normal breath sounds.  Abdominal:     Palpations: Abdomen is soft.     Tenderness: There is no abdominal tenderness.  Musculoskeletal:        General: Normal range of motion.     Cervical back: Normal range of motion and neck supple.  Skin:    General: Skin is warm and dry.     Findings: Laceration present.     Comments: Laceration to the tip of the left middle finger, U shaped, lac ends at the nail bed on the medial aspect of the finger  Neurological:     Mental Status: He is alert.      UC Treatments / Results  Labs (all labs ordered are listed, but only abnormal results are displayed) Labs Reviewed - No data to display  EKG   Radiology No results found.  Procedures Laceration Repair  Date/Time: 10/13/2019 3:38  PM Performed by: Moshe Cipro, NP Authorized by: Moshe Cipro, NP   Consent:    Consent obtained:  Verbal   Consent given by:  Patient   Risks discussed:  Infection and poor cosmetic result   Alternatives discussed:  No treatment Anesthesia (see MAR for exact dosages):    Anesthesia method:  Local infiltration   Local anesthetic:  Lidocaine 2% w/o epi Laceration details:    Location:  Finger   Finger location:  L long finger   Length (cm):  2   Depth (mm):  4 Repair type:    Repair type:  Simple Exploration:    Hemostasis achieved with:  Direct pressure   Wound exploration: entire depth of wound probed and visualized     Contaminated: no   Treatment:    Area cleansed with:  Betadine, Hibiclens and saline   Amount of cleaning:  Standard   Irrigation solution:  Sterile saline   Irrigation volume:  10   Irrigation method:  Syringe   Visualized foreign bodies/material removed: no   Skin repair:    Repair method:  Sutures and tissue adhesive   Suture size:  4-0   Suture material:  Prolene   Number of sutures:  5 Approximation:    Approximation:  Close Post-procedure details:    Dressing:  Antibiotic ointment, bulky dressing and non-adherent dressing   Patient tolerance of procedure:  Tolerated well, no immediate complications   (including critical care time)  Medications Ordered in UC Medications - No data to display  Initial Impression / Assessment and Plan / UC Course  I have reviewed the triage vital signs and the nursing notes.  Pertinent labs & imaging results that were available during my care of the patient were reviewed by me and considered in my medical decision making (see chart for details).     Left finger pain Left finger lac  Presents with laceration to the left finger that occurred about an hour ago Reports that he cut it while he was working with a metal wedge and wood Tdap is up-to-date  5 sutures placed, Dermabond to the anterior  aspect of the finger along the medial nail bed Nonadherent dressing applied, bulky dressing applied Instructed to follow-up in 7 to 10 days for suture removal Instructed to follow-up if he notices any redness, swelling, heat tenderness to the area, any drainage from the area Follow-up with the ER for any streaking up the arm, high fever, other concerning symptoms  Verbalizes understanding is in agreement with treatment plan  Final Clinical Impressions(s) / UC Diagnoses   Final diagnoses:  Finger pain, left  Laceration of left middle finger with damage to nail, foreign body presence unspecified, initial encounter     Discharge Instructions     Instructed to follow-up in 7 to 10 days for suture removal  Instructed to follow-up if he notices any redness, swelling, heat tenderness to the area, any drainage from the area  Sent in Keflex to pharmacy in case of infection  Follow-up with the ER for any streaking up the arm, high fever, other concerning symptoms     ED Prescriptions    Medication Sig Dispense Auth. Provider   cephALEXin (KEFLEX) 500 MG capsule Take 1 capsule (500 mg total) by mouth 2 (two) times daily for 5 days. 10 capsule Moshe Cipro, NP     PDMP not reviewed this encounter.   Moshe Cipro, NP 10/13/19 1549

## 2020-11-18 ENCOUNTER — Encounter: Payer: Self-pay | Admitting: Family Medicine

## 2020-11-18 ENCOUNTER — Other Ambulatory Visit: Payer: Self-pay

## 2020-11-18 ENCOUNTER — Ambulatory Visit (INDEPENDENT_AMBULATORY_CARE_PROVIDER_SITE_OTHER): Payer: 59 | Admitting: Family Medicine

## 2020-11-18 VITALS — BP 118/81 | HR 86 | Temp 98.1°F | Ht 69.0 in | Wt 181.5 lb

## 2020-11-18 DIAGNOSIS — Z0000 Encounter for general adult medical examination without abnormal findings: Secondary | ICD-10-CM | POA: Diagnosis not present

## 2020-11-18 DIAGNOSIS — Z1159 Encounter for screening for other viral diseases: Secondary | ICD-10-CM | POA: Diagnosis not present

## 2020-11-18 DIAGNOSIS — Z1211 Encounter for screening for malignant neoplasm of colon: Secondary | ICD-10-CM | POA: Diagnosis not present

## 2020-11-18 LAB — CBC
HCT: 42.4 % (ref 39.0–52.0)
Hemoglobin: 14.5 g/dL (ref 13.0–17.0)
MCHC: 34.2 g/dL (ref 30.0–36.0)
MCV: 87.1 fl (ref 78.0–100.0)
Platelets: 236 10*3/uL (ref 150.0–400.0)
RBC: 4.87 Mil/uL (ref 4.22–5.81)
RDW: 13.1 % (ref 11.5–15.5)
WBC: 6 10*3/uL (ref 4.0–10.5)

## 2020-11-18 LAB — COMPREHENSIVE METABOLIC PANEL
ALT: 22 U/L (ref 0–53)
AST: 17 U/L (ref 0–37)
Albumin: 4.2 g/dL (ref 3.5–5.2)
Alkaline Phosphatase: 71 U/L (ref 39–117)
BUN: 15 mg/dL (ref 6–23)
CO2: 29 mEq/L (ref 19–32)
Calcium: 9.4 mg/dL (ref 8.4–10.5)
Chloride: 103 mEq/L (ref 96–112)
Creatinine, Ser: 1.06 mg/dL (ref 0.40–1.50)
GFR: 83.86 mL/min (ref >60.00)
Glucose, Bld: 96 mg/dL (ref 70–99)
Potassium: 4.1 mEq/L (ref 3.5–5.1)
Sodium: 139 mEq/L (ref 135–145)
Total Bilirubin: 0.8 mg/dL (ref 0.2–1.2)
Total Protein: 6.6 g/dL (ref 6.0–8.3)

## 2020-11-18 LAB — LIPID PANEL
Cholesterol: 219 mg/dL — ABNORMAL HIGH (ref 0–200)
HDL: 41.1 mg/dL (ref >39.00)
LDL Cholesterol: 156 mg/dL — ABNORMAL HIGH (ref 0–99)
NonHDL: 177.84
Total CHOL/HDL Ratio: 5
Triglycerides: 110 mg/dL (ref 0.0–149.0)
VLDL: 22 mg/dL (ref 0.0–40.0)

## 2020-11-18 NOTE — Patient Instructions (Addendum)
Give [place] 2-3 business days to get the results of your labs back.   Keep the diet clean and stay active.  I recommend getting the flu shot in mid October. This suggestion would change if the CDC comes out with a different recommendation.   If you do not hear anything about your referral in the next 1-2 weeks, call our office and ask for an update.  Sleep is important to [place] all. Getting good sleep is imperative to adequate functioning during the day. Work with our counselors who are trained to help people obtain quality sleep. Call [phone] to schedule an appointment or if you are curious about insurance coverage/cost.  Sleep Hygiene Tips: Do not watch TV or look at screens within 1 hour of going to bed. If you do, make sure there is a blue light filter (nighttime mode) involved. Try to go to bed around the same time every night. Wake up at the same time within 1 hour of regular time. Ex: If you wake up at 7 AM for work, do not sleep past 8 AM on days that you don't work. Do not drink alcohol before bedtime. Do not consume caffeine-containing beverages after noon or within 9 hours of intended bedtime. Get regular exercise/physical activity in your life, but not within 2 hours of planned bedtime. Do not take naps.  Do not eat within 2 hours of planned bedtime. Melatonin, 3-5 mg 30-60 minutes before planned bedtime may be helpful.  The bed should be for sleep or sex only. If after 20-30 minutes you are unable to fall asleep, get up and do something relaxing. Do this until you feel ready to go to sleep again.    Let us know if you need anything.

## 2020-11-18 NOTE — Progress Notes (Signed)
Chief Complaint  Patient presents with   Annual Exam    Well Male Jerry Rowland is here for a complete physical.   His last physical was >1 year ago.  Current diet: in general, diet could be better.   Current exercise: active outdoors, coaches lacrosse/football Weight trend: stable Fatigue out of ordinary? No. Seat belt? Yes.    Health maintenance Tetanus- Yes HIV- Yes Hep C- No CCS- No  Past Medical History:  Diagnosis Date   Nephrolithiasis      Past Surgical History:  Procedure Laterality Date   ANKLE FRACTURE SURGERY Right    MOUTH SURGERY     Implants   WISDOM TOOTH EXTRACTION      Medications  Takes no meds routinely.    Allergies Allergies  Allergen Reactions   Codeine Itching    Family History Family History  Problem Relation Age of Onset   Prostate cancer Neg Hx    Bladder Cancer Neg Hx    Kidney cancer Neg Hx     Review of Systems: Constitutional: no fevers or chills Eye:  no recent significant change in vision Ear/Nose/Mouth/Throat:  Ears:  no hearing loss Nose/Mouth/Throat:  no complaints of nasal congestion, no sore throat Cardiovascular:  no chest pain Respiratory:  no shortness of breath Gastrointestinal:  no abdominal pain, no change in bowel habits GU:  Male: negative for dysuria, frequency, and incontinence Musculoskeletal/Extremities:  no new pain of the joints Integumentary (Skin/Breast):  no abnormal skin lesions reported Neurologic:  no headaches Endocrine: No unexpected weight changes Hematologic/Lymphatic:  no night sweats  Exam BP 118/81   Pulse 86   Temp 98.1 F (36.7 C) (Oral)   Ht 5\' 9"  (1.753 m)   Wt 181 lb 8 oz (82.3 kg)   SpO2 98%   BMI 26.80 kg/m  General:  well developed, well nourished, in no apparent distress Skin:  no significant moles, warts, or growths Head:  no masses, lesions, or tenderness Eyes:  pupils equal and round, sclera anicteric without injection Ears:  canals without lesions, TMs shiny  without retraction, no obvious effusion, no erythema Nose:  nares patent, septum midline, mucosa normal Throat/Pharynx:  lips and gingiva without lesion; tongue and uvula midline; non-inflamed pharynx; no exudates or postnasal drainage Neck: neck supple without adenopathy, thyromegaly, or masses Lungs:  clear to auscultation, breath sounds equal bilaterally, no respiratory distress Cardio:  regular rate and rhythm, no bruits, no LE edema Abdomen:  abdomen soft, nontender; bowel sounds normal; no masses or organomegaly Rectal: Deferred Musculoskeletal: +TTP in subocc triangle. Otherwise symmetrical muscle groups noted without atrophy or deformity Extremities:  no clubbing, cyanosis, or edema, no deformities, no skin discoloration Neuro:  gait normal; deep tendon reflexes normal and symmetric Psych: well oriented with normal range of affect and appropriate judgment/insight  Assessment and Plan  Well adult exam - Plan: CBC, Comprehensive metabolic panel, Lipid panel  Encounter for hepatitis C screening test for low risk patient - Plan: Hepatitis C antibody  Screen for colon cancer - Plan: Ambulatory referral to Gastroenterology   Well 45 y.o. male. Counseled on diet and exercise. Counseled on risks and benefits of prostate cancer screening with PSA. The patient agrees to forego screening.  Other orders as above. Flu shot rec'd for Oct. Covid booster update rec'd for next mo. CCS as above.  Follow up in 1 yr pending the above workup. The patient voiced understanding and agreement to the plan.  [place], DO 11/18/20 8:20 AM

## 2020-11-21 ENCOUNTER — Other Ambulatory Visit: Payer: Self-pay | Admitting: Family Medicine

## 2020-11-21 DIAGNOSIS — E785 Hyperlipidemia, unspecified: Secondary | ICD-10-CM

## 2020-11-21 LAB — HEPATITIS C ANTIBODY
Hepatitis C Ab: NONREACTIVE
SIGNAL TO CUT-OFF: 0.01 (ref <1.00)

## 2021-01-20 ENCOUNTER — Other Ambulatory Visit (INDEPENDENT_AMBULATORY_CARE_PROVIDER_SITE_OTHER): Payer: 59

## 2021-01-20 ENCOUNTER — Other Ambulatory Visit: Payer: Self-pay

## 2021-01-20 ENCOUNTER — Other Ambulatory Visit: Payer: Self-pay | Admitting: Family Medicine

## 2021-01-20 DIAGNOSIS — E785 Hyperlipidemia, unspecified: Secondary | ICD-10-CM

## 2021-01-20 LAB — LIPID PANEL
Cholesterol: 224 mg/dL — ABNORMAL HIGH (ref 0–200)
HDL: 40 mg/dL (ref >39.00)
LDL Cholesterol: 155 mg/dL — ABNORMAL HIGH (ref 0–99)
NonHDL: 183.81
Total CHOL/HDL Ratio: 6
Triglycerides: 142 mg/dL (ref 0.0–149.0)
VLDL: 28.4 mg/dL (ref 0.0–40.0)

## 2021-08-20 IMAGING — CR DG HAND COMPLETE 3+V*L*
3 series · 3 of 3 positions shown · non-contrast
Comparison: None.

CLINICAL DATA: Left hand injury

EXAM:
LEFT HAND - COMPLETE 3+ VIEW

[hand ap]
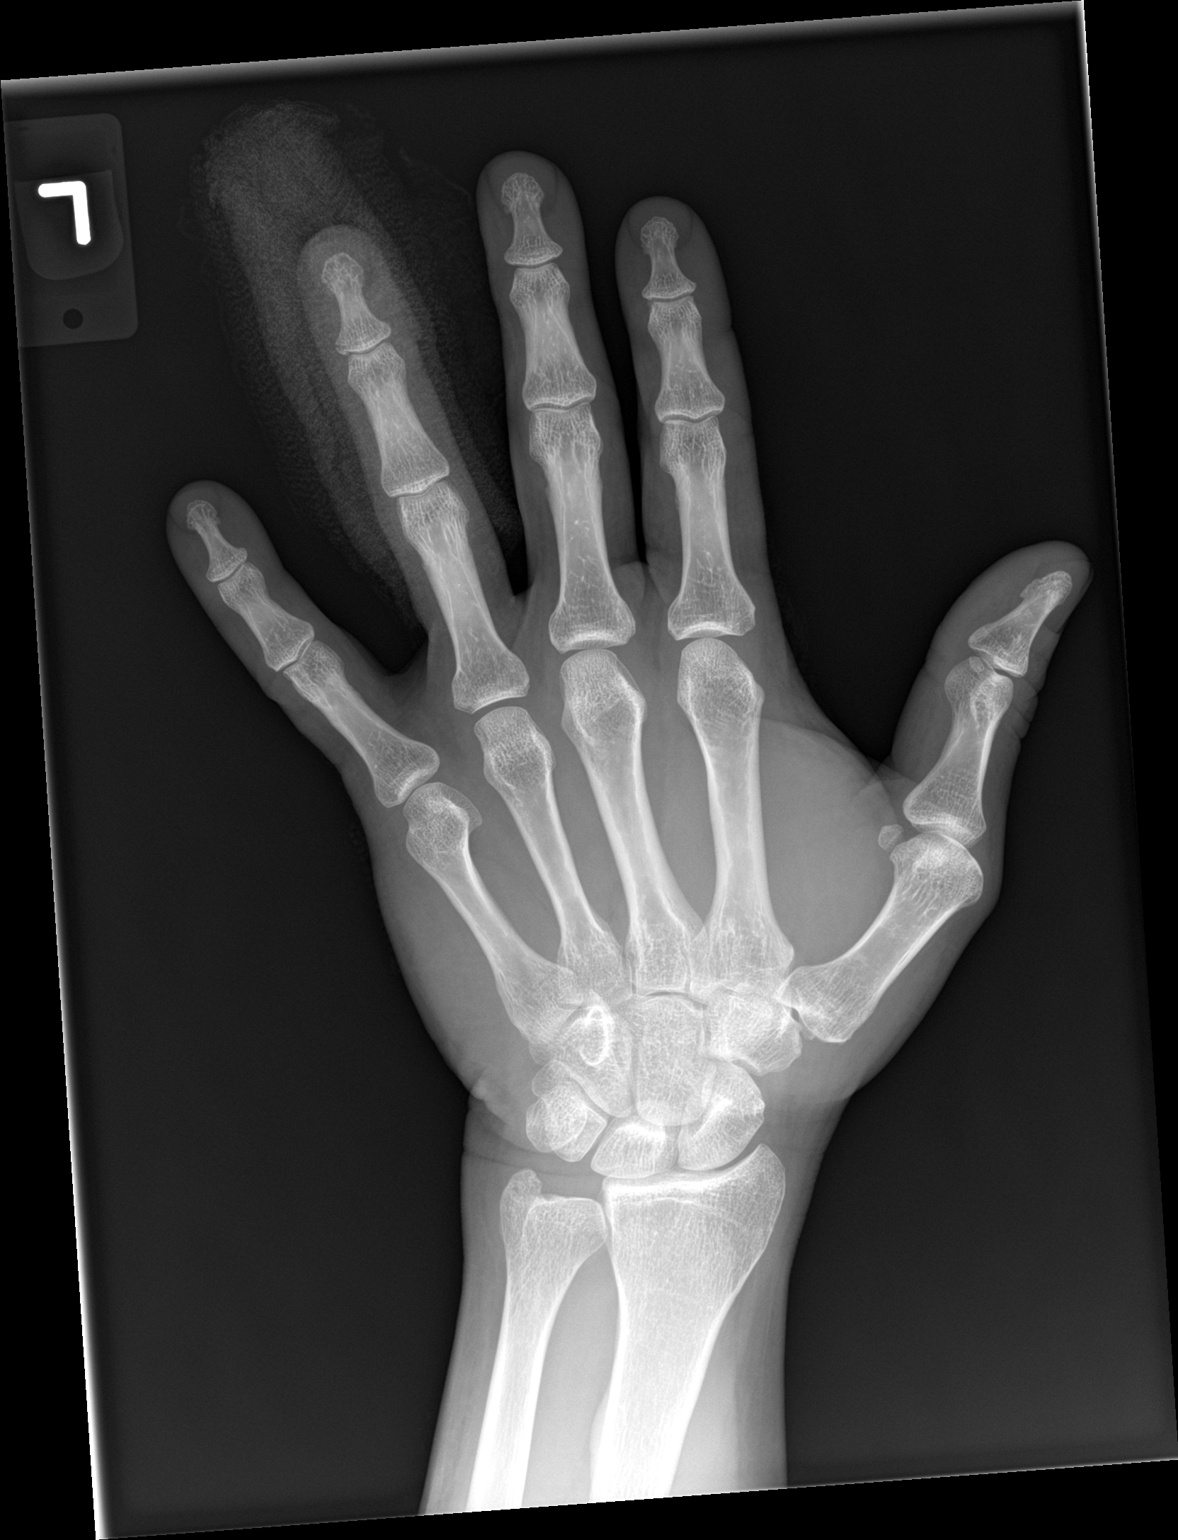

[hand obl]
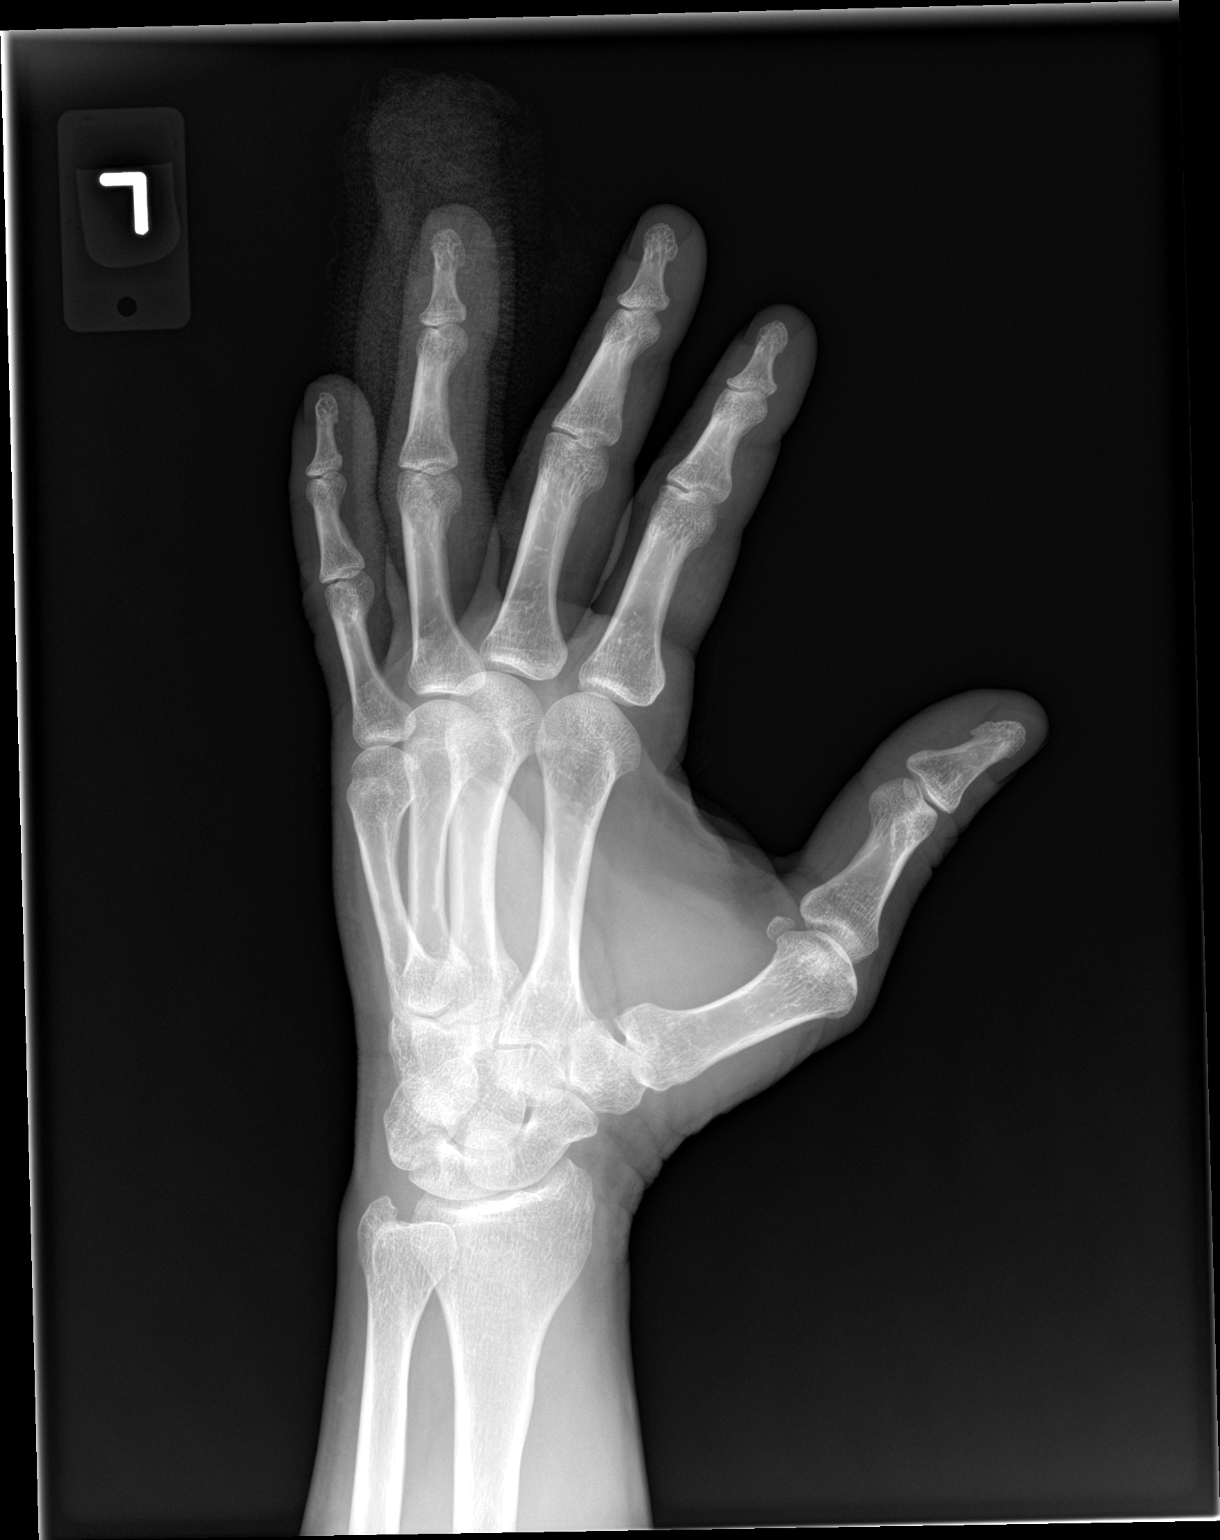

[hand lat]
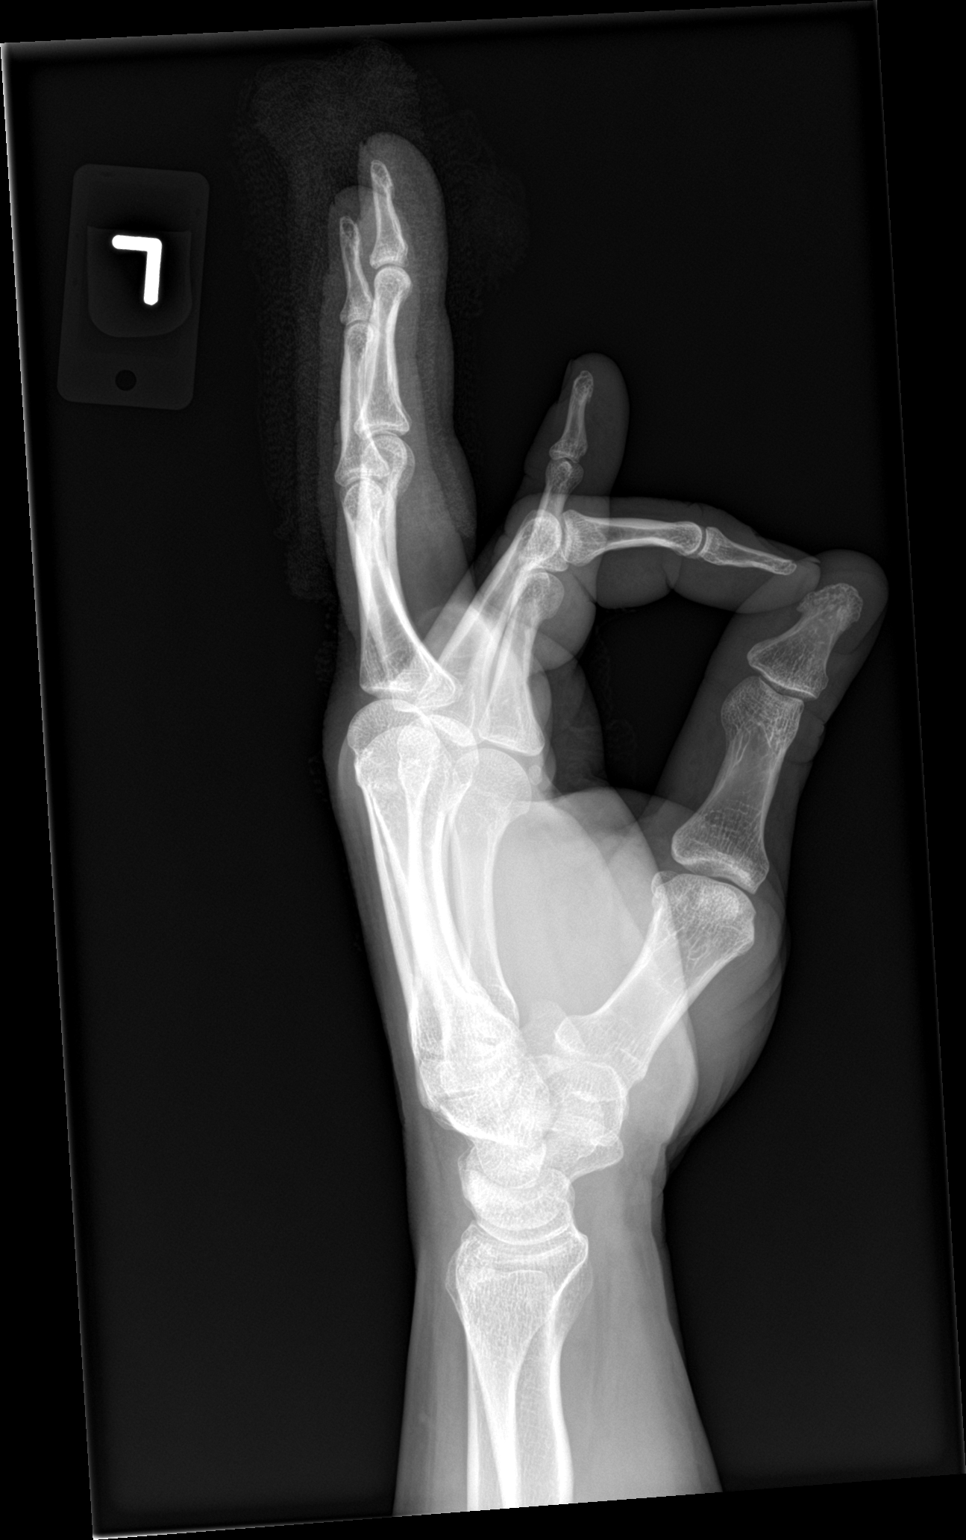

[3 of 3 positions shown; findings below may reference images not displayed]

FINDINGS: There is no evidence of fracture or dislocation. There is no
evidence of arthropathy or other focal bone abnormality. There is a
soft tissue laceration with soft tissue swelling seen overlying the
second digit. No radiopaque foreign body.
IMPRESSION: Negative.

## 2021-12-01 ENCOUNTER — Encounter: Payer: Self-pay | Admitting: Family Medicine

## 2021-12-01 ENCOUNTER — Ambulatory Visit (INDEPENDENT_AMBULATORY_CARE_PROVIDER_SITE_OTHER): Payer: 59 | Admitting: Family Medicine

## 2021-12-01 VITALS — BP 111/72 | HR 78 | Temp 98.0°F | Ht 69.0 in | Wt 175.0 lb

## 2021-12-01 DIAGNOSIS — Z0000 Encounter for general adult medical examination without abnormal findings: Secondary | ICD-10-CM

## 2021-12-01 DIAGNOSIS — E785 Hyperlipidemia, unspecified: Secondary | ICD-10-CM

## 2021-12-01 DIAGNOSIS — Z1211 Encounter for screening for malignant neoplasm of colon: Secondary | ICD-10-CM

## 2021-12-01 LAB — COMPREHENSIVE METABOLIC PANEL
ALT: 19 U/L (ref 0–53)
AST: 16 U/L (ref 0–37)
Albumin: 4.3 g/dL (ref 3.5–5.2)
Alkaline Phosphatase: 71 U/L (ref 39–117)
BUN: 18 mg/dL (ref 6–23)
CO2: 29 mEq/L (ref 19–32)
Calcium: 9.4 mg/dL (ref 8.4–10.5)
Chloride: 103 mEq/L (ref 96–112)
Creatinine, Ser: 1.01 mg/dL (ref 0.40–1.50)
GFR: 88.22 mL/min (ref >60.00)
Glucose, Bld: 89 mg/dL (ref 70–99)
Potassium: 4.2 mEq/L (ref 3.5–5.1)
Sodium: 139 mEq/L (ref 135–145)
Total Bilirubin: 0.8 mg/dL (ref 0.2–1.2)
Total Protein: 6.7 g/dL (ref 6.0–8.3)

## 2021-12-01 LAB — CBC
HCT: 43.4 % (ref 39.0–52.0)
Hemoglobin: 14.9 g/dL (ref 13.0–17.0)
MCHC: 34.4 g/dL (ref 30.0–36.0)
MCV: 88.5 fl (ref 78.0–100.0)
Platelets: 232 10*3/uL (ref 150.0–400.0)
RBC: 4.91 Mil/uL (ref 4.22–5.81)
RDW: 12.7 % (ref 11.5–15.5)
WBC: 5 10*3/uL (ref 4.0–10.5)

## 2021-12-01 LAB — LIPID PANEL
Cholesterol: 217 mg/dL — ABNORMAL HIGH (ref 0–200)
HDL: 41.4 mg/dL (ref >39.00)
LDL Cholesterol: 160 mg/dL — ABNORMAL HIGH (ref 0–99)
NonHDL: 175.29
Total CHOL/HDL Ratio: 5
Triglycerides: 78 mg/dL (ref 0.0–149.0)
VLDL: 15.6 mg/dL (ref 0.0–40.0)

## 2021-12-01 NOTE — Patient Instructions (Addendum)
Give [place] 2-3 business days to get the results of your labs back.   Keep the diet clean and stay active.  Please get me a copy of your advanced directive form at your convenience.   If you do not hear anything about your Cologard and/or heart scan in the next 1-2 weeks, call our office and ask for an update.  I recommend getting the flu shot in mid October. This suggestion would change if the CDC comes out with a different recommendation.   Let us know if you need anything.  EXERCISES  RANGE OF MOTION (ROM) AND STRETCHING EXERCISES - Low Back Pain Most people with lower back pain will find that their symptoms get worse with excessive bending forward (flexion) or arching at the lower back (extension). The exercises that will help resolve your symptoms will focus on the opposite motion.  If you have pain, numbness or tingling which travels down into your buttocks, leg or foot, the goal of the therapy is for these symptoms to move closer to your back and eventually resolve. Sometimes, these leg symptoms will get better, but your lower back pain may worsen. This is often an indication of progress in your rehabilitation. Be very alert to any changes in your symptoms and the activities in which you participated in the 24 hours prior to the change. Sharing this information with your caregiver will allow him or her to most efficiently treat your condition. These exercises may help you when beginning to rehabilitate your injury. Your symptoms may resolve with or without further involvement from your physician, physical therapist or athletic trainer. While completing these exercises, remember:  Restoring tissue flexibility helps normal motion to return to the joints. This allows healthier, less painful movement and activity. An effective stretch should be held for at least 30 seconds. A stretch should never be painful. You should only feel a gentle lengthening or release in the stretched tissue. FLEXION  RANGE OF MOTION AND STRETCHING EXERCISES:  STRETCH - Flexion, Single Knee to Chest  Lie on a firm bed or floor with both legs extended in front of you. Keeping one leg in contact with the floor, bring your opposite knee to your chest. Hold your leg in place by either grabbing behind your thigh or at your knee. Pull until you feel a gentle stretch in your low back. Hold 30 seconds. Slowly release your grasp and repeat the exercise with the opposite side. Repeat 2 times. Complete this exercise 3 times per week.   STRETCH - Flexion, Double Knee to Chest Lie on a firm bed or floor with both legs extended in front of you. Keeping one leg in contact with the floor, bring your opposite knee to your chest. Tense your stomach muscles to support your back and then lift your other knee to your chest. Hold your legs in place by either grabbing behind your thighs or at your knees. Pull both knees toward your chest until you feel a gentle stretch in your low back. Hold 30 seconds. Tense your stomach muscles and slowly return one leg at a time to the floor. Repeat 2 times. Complete this exercise 3 times per week.   STRETCH - Low Trunk Rotation Lie on a firm bed or floor. Keeping your legs in front of you, bend your knees so they are both pointed toward the ceiling and your feet are flat on the floor. Extend your arms out to the side. This will stabilize your upper body by keeping your  shoulders in contact with the floor. Gently and slowly drop both knees together to one side until you feel a gentle stretch in your low back. Hold for 30 seconds. Tense your stomach muscles to support your lower back as you bring your knees back to the starting position. Repeat the exercise to the other side. Repeat 2 times. Complete this exercise at least 3 times per week.   EXTENSION RANGE OF MOTION AND FLEXIBILITY EXERCISES:  STRETCH - Extension, Prone on Elbows  Lie on your stomach on the floor, a bed will be too soft.  Place your palms about shoulder width apart and at the height of your head. Place your elbows under your shoulders. If this is too painful, stack pillows under your chest. Allow your body to relax so that your hips drop lower and make contact more completely with the floor. Hold this position for 30 seconds. Slowly return to lying flat on the floor. Repeat 2 times. Complete this exercise 3 times per week.   RANGE OF MOTION - Extension, Prone Press Ups Lie on your stomach on the floor, a bed will be too soft. Place your palms about shoulder width apart and at the height of your head. Keeping your back as relaxed as possible, slowly straighten your elbows while keeping your hips on the floor. You may adjust the placement of your hands to maximize your comfort. As you gain motion, your hands will come more underneath your shoulders. Hold this position 30 seconds. Slowly return to lying flat on the floor. Repeat 2 times. Complete this exercise 3 times per week.   RANGE OF MOTION- Quadruped, Neutral Spine  Assume a hands and knees position on a firm surface. Keep your hands under your shoulders and your knees under your hips. You may place padding under your knees for comfort. Drop your head and point your tailbone toward the ground below you. This will round out your lower back like an angry cat. Hold this position for 30 seconds. Slowly lift your head and release your tail bone so that your back sags into a large arch, like an old horse. Hold this position for 30 seconds. Repeat this until you feel limber in your low back. Now, find your "sweet spot." This will be the most comfortable position somewhere between the two previous positions. This is your neutral spine. Once you have found this position, tense your stomach muscles to support your low back. Hold this position for 30 seconds. Repeat 2 times. Complete this exercise 3 times per week.   STRENGTHENING EXERCISES - Low Back Sprain These  exercises may help you when beginning to rehabilitate your injury. These exercises should be done near your "sweet spot." This is the neutral, low-back arch, somewhere between fully rounded and fully arched, that is your least painful position. When performed in this safe range of motion, these exercises can be used for people who have either a flexion or extension based injury. These exercises may resolve your symptoms with or without further involvement from your physician, physical therapist or athletic trainer. While completing these exercises, remember:  Muscles can gain both the endurance and the strength needed for everyday activities through controlled exercises. Complete these exercises as instructed by your physician, physical therapist or athletic trainer. Increase the resistance and repetitions only as guided. You may experience muscle soreness or fatigue, but the pain or discomfort you are trying to eliminate should never worsen during these exercises. If this pain does worsen, stop and make  certain you are following the directions exactly. If the pain is still present after adjustments, discontinue the exercise until you can discuss the trouble with your caregiver.  STRENGTHENING - Deep Abdominals, Pelvic Tilt  Lie on a firm bed or floor. Keeping your legs in front of you, bend your knees so they are both pointed toward the ceiling and your feet are flat on the floor. Tense your lower abdominal muscles to press your low back into the floor. This motion will rotate your pelvis so that your tail bone is scooping upwards rather than pointing at your feet or into the floor. With a gentle tension and even breathing, hold this position for 3 seconds. Repeat 2 times. Complete this exercise 3 times per week.   STRENGTHENING - Abdominals, Crunches  Lie on a firm bed or floor. Keeping your legs in front of you, bend your knees so they are both pointed toward the ceiling and your feet are flat on the  floor. Cross your arms over your chest. Slightly tip your chin down without bending your neck. Tense your abdominals and slowly lift your trunk high enough to just clear your shoulder blades. Lifting higher can put excessive stress on the lower back and does not further strengthen your abdominal muscles. Control your return to the starting position. Repeat 2 times. Complete this exercise 3 times per week.   STRENGTHENING - Quadruped, Opposite UE/LE Lift  Assume a hands and knees position on a firm surface. Keep your hands under your shoulders and your knees under your hips. You may place padding under your knees for comfort. Find your neutral spine and gently tense your abdominal muscles so that you can maintain this position. Your shoulders and hips should form a rectangle that is parallel with the floor and is not twisted. Keeping your trunk steady, lift your right hand no higher than your shoulder and then your left leg no higher than your hip. Make sure you are not holding your breath. Hold this position for 30 seconds. Continuing to keep your abdominal muscles tense and your back steady, slowly return to your starting position. Repeat with the opposite arm and leg. Repeat 2 times. Complete this exercise 3 times per week.   STRENGTHENING - Abdominals and Quadriceps, Straight Leg Raise  Lie on a firm bed or floor with both legs extended in front of you. Keeping one leg in contact with the floor, bend the other knee so that your foot can rest flat on the floor. Find your neutral spine, and tense your abdominal muscles to maintain your spinal position throughout the exercise. Slowly lift your straight leg off the floor about 6 inches for a count of 3, making sure to not hold your breath. Still keeping your neutral spine, slowly lower your leg all the way to the floor. Repeat this exercise with each leg 2 times. Complete this exercise 3 times per week.  POSTURE AND BODY MECHANICS CONSIDERATIONS  - Low Back Sprain Keeping correct posture when sitting, standing or completing your activities will reduce the stress put on different body tissues, allowing injured tissues a chance to heal and limiting painful experiences. The following are general guidelines for improved posture.  While reading these guidelines, remember: The exercises prescribed by your provider will help you have the flexibility and strength to maintain correct postures. The correct posture provides the best environment for your joints to work. All of your joints have less wear and tear when properly supported by a spine with  good posture. This means you will experience a healthier, less painful body. Correct posture must be practiced with all of your activities, especially prolonged sitting and standing. Correct posture is as important when doing repetitive low-stress activities (typing) as it is when doing a single heavy-load activity (lifting).  RESTING POSITIONS Consider which positions are most painful for you when choosing a resting position. If you have pain with flexion-based activities (sitting, bending, stooping, squatting), choose a position that allows you to rest in a less flexed posture. You would want to avoid curling into a fetal position on your side. If your pain worsens with extension-based activities (prolonged standing, working overhead), avoid resting in an extended position such as sleeping on your stomach. Most people will find more comfort when they rest with their spine in a more neutral position, neither too rounded nor too arched. Lying on a non-sagging bed on your side with a pillow between your knees, or on your back with a pillow under your knees will often provide some relief. Keep in mind, being in any one position for a prolonged period of time, no matter how correct your posture, can still lead to stiffness.  PROPER SITTING POSTURE In order to minimize stress and discomfort on your spine, you must  sit with correct posture. Sitting with good posture should be effortless for a healthy body. Returning to good posture is a gradual process. Many people can work toward this most comfortably by using various supports until they have the flexibility and strength to maintain this posture on their own. When sitting with proper posture, your ears will fall over your shoulders and your shoulders will fall over your hips. You should use the back of the chair to support your upper back. Your lower back will be in a neutral position, just slightly arched. You may place a small pillow or folded towel at the base of your lower back for  support.  When working at a desk, create an environment that supports good, upright posture. Without extra support, muscles tire, which leads to excessive strain on joints and other tissues. Keep these recommendations in mind:  CHAIR: A chair should be able to slide under your desk when your back makes contact with the back of the chair. This allows you to work closely. The chair's height should allow your eyes to be level with the upper part of your monitor and your hands to be slightly lower than your elbows.  BODY POSITION Your feet should make contact with the floor. If this is not possible, use a foot rest. Keep your ears over your shoulders. This will reduce stress on your neck and low back.  INCORRECT SITTING POSTURES  If you are feeling tired and unable to assume a healthy sitting posture, do not slouch or slump. This puts excessive strain on your back tissues, causing more damage and pain. Healthier options include: Using more support, like a lumbar pillow. Switching tasks to something that requires you to be upright or walking. Talking a brief walk. Lying down to rest in a neutral-spine position.  PROLONGED STANDING WHILE SLIGHTLY LEANING FORWARD  When completing a task that requires you to lean forward while standing in one place for a long time, place either  foot up on a stationary 2-4 inch high object to help maintain the best posture. When both feet are on the ground, the lower back tends to lose its slight inward curve. If this curve flattens (or becomes too large), then the back  and your other joints will experience too much stress, tire more quickly, and can cause pain.  CORRECT STANDING POSTURES Proper standing posture should be assumed with all daily activities, even if they only take a few moments, like when brushing your teeth. As in sitting, your ears should fall over your shoulders and your shoulders should fall over your hips. You should keep a slight tension in your abdominal muscles to brace your spine. Your tailbone should point down to the ground, not behind your body, resulting in an over-extended swayback posture.   INCORRECT STANDING POSTURES  Common incorrect standing postures include a forward head, locked knees and/or an excessive swayback. WALKING Walk with an upright posture. Your ears, shoulders and hips should all line-up.  PROLONGED ACTIVITY IN A FLEXED POSITION When completing a task that requires you to bend forward at your waist or lean over a low surface, try to find a way to stabilize 3 out of 4 of your limbs. You can place a hand or elbow on your thigh or rest a knee on the surface you are reaching across. This will provide you more stability, so that your muscles do not tire as quickly. By keeping your knees relaxed, or slightly bent, you will also reduce stress across your lower back. CORRECT LIFTING TECHNIQUES  DO : Assume a wide stance. This will provide you more stability and the opportunity to get as close as possible to the object which you are lifting. Tense your abdominals to brace your spine. Bend at the knees and hips. Keeping your back locked in a neutral-spine position, lift using your leg muscles. Lift with your legs, keeping your back straight. Test the weight of unknown objects before attempting to lift  them. Try to keep your elbows locked down at your sides in order get the best strength from your shoulders when carrying an object.   Always ask for help when lifting heavy or awkward objects. INCORRECT LIFTING TECHNIQUES DO NOT:  Lock your knees when lifting, even if it is a small object. Bend and twist. Pivot at your feet or move your feet when needing to change directions. Assume that you can safely pick up even a paperclip without proper posture.

## 2021-12-01 NOTE — Progress Notes (Signed)
Chief Complaint  Patient presents with   Annual Exam    Back pain     Well Male Jerry Rowland is here for a complete physical.   His last physical was >1 year ago.  Current diet: in general, a "healthy" diet.   Current exercise: stays busy coaching football Weight trend: intentionally decreasing a little Fatigue out of ordinary? No. Seat belt? Yes.   Advanced directive? Yes  Health maintenance Tetanus- Yes HIV- Yes Hep C- Yes  Low back pain A couple weeks ago, was working on lawnmower and now has pain in lower back region. No bruising, redness, swelling, bowel/bladder incontinence, weakness. Has tried Motrin intermittently. No neuro s/s's.   Past Medical History:  Diagnosis Date   Nephrolithiasis      Past Surgical History:  Procedure Laterality Date   ANKLE FRACTURE SURGERY Right    MOUTH SURGERY     Implants   WISDOM TOOTH EXTRACTION      Medications  Takes no meds routinely.    Allergies Allergies  Allergen Reactions   Codeine Itching    Family History Family History  Problem Relation Age of Onset   Prostate cancer Neg Hx    Bladder Cancer Neg Hx    Kidney cancer Neg Hx     Review of Systems: Constitutional: no fevers or chills Eye:  no recent significant change in vision Ear/Nose/Mouth/Throat:  Ears:  no hearing loss Nose/Mouth/Throat:  no complaints of nasal congestion, no sore throat Cardiovascular:  no chest pain Respiratory:  no shortness of breath Gastrointestinal:  no abdominal pain, no change in bowel habits GU:  Male: negative for dysuria, frequency, and incontinence Musculoskeletal/Extremities: +Back pain Integumentary (Skin/Breast):  no abnormal skin lesions reported Neurologic:  no headaches Endocrine: No unexpected weight changes Hematologic/Lymphatic:  no night sweats  Exam BP 111/72   Pulse 78   Temp 98 F (36.7 C) (Oral)   Ht 5\' 9"  (1.753 m)   Wt 175 lb (79.4 kg)   SpO2 99%   BMI 25.84 kg/m  General:  well  developed, well nourished, in no apparent distress Skin:  no significant moles, warts, or growths Head:  no masses, lesions, or tenderness Eyes:  pupils equal and round, sclera anicteric without injection Ears:  canals without lesions, TMs shiny without retraction, no obvious effusion, no erythema Nose:  nares patent, septum midline, mucosa normal Throat/Pharynx:  lips and gingiva without lesion; tongue and uvula midline; non-inflamed pharynx; no exudates or postnasal drainage Neck: neck supple without adenopathy, thyromegaly, or masses Lungs:  clear to auscultation, breath sounds equal bilaterally, no respiratory distress Cardio:  regular rate and rhythm, no bruits, no LE edema Abdomen:  abdomen soft, nontender; bowel sounds normal; no masses or organomegaly Rectal: Deferred Musculoskeletal: ttp in R lumbar parasp msc; tight hamstrings b/l; symmetrical muscle groups noted without atrophy or deformity Extremities:  no clubbing, cyanosis, or edema, no deformities, no skin discoloration Neuro:  gait normal; deep tendon reflexes normal and symmetric Psych: well oriented with normal range of affect and appropriate judgment/insight  Assessment and Plan  Well adult exam - Plan: CBC, Comprehensive metabolic panel, Lipid panel  Colon cancer screening - Plan: Cologuard  Hyperlipidemia, unspecified hyperlipidemia type - Plan: CT CARDIAC SCORING (SELF PAY ONLY)   Well 46 y.o. male. Counseled on diet and exercise. Counseled on risks and benefits of prostate cancer screening with PSA. The patient agrees to forego screening.  CCS: Cologard Stretches/exercises for low back, PT if no better.  Advanced directive  form provided today.  Other orders as above. Follow up in 1 yr pending the above workup. The patient voiced understanding and agreement to the plan.  Jilda Roche Jackson, DO 12/01/21 8:27 AM

## 2021-12-15 ENCOUNTER — Ambulatory Visit
Admission: RE | Admit: 2021-12-15 | Discharge: 2021-12-15 | Disposition: A | Discharge status: Home / Self Care | Payer: 59 | Source: Ambulatory Visit | Attending: Family Medicine | Admitting: Family Medicine

## 2021-12-15 DIAGNOSIS — E785 Hyperlipidemia, unspecified: Secondary | ICD-10-CM | POA: Insufficient documentation

## 2021-12-18 ENCOUNTER — Encounter: Payer: Self-pay | Admitting: Family Medicine

## 2021-12-19 ENCOUNTER — Other Ambulatory Visit: Payer: Self-pay

## 2021-12-19 ENCOUNTER — Other Ambulatory Visit: Payer: Self-pay | Admitting: Family Medicine

## 2021-12-19 DIAGNOSIS — E785 Hyperlipidemia, unspecified: Secondary | ICD-10-CM

## 2021-12-19 MED ORDER — ATORVASTATIN CALCIUM 40 MG PO TABS
40.0000 mg | ORAL_TABLET | Freq: Every day | ORAL | 3 refills | Status: DC
  Filled 2021-12-19: qty 30, 30d supply, fill #0
  Filled 2022-01-15: qty 30, 30d supply, fill #1
  Filled 2022-02-14: qty 30, 30d supply, fill #2
  Filled 2022-03-20: qty 30, 30d supply, fill #3

## 2021-12-22 DIAGNOSIS — Z1211 Encounter for screening for malignant neoplasm of colon: Secondary | ICD-10-CM | POA: Diagnosis not present

## 2021-12-28 LAB — COLOGUARD: COLOGUARD: NEGATIVE

## 2021-12-29 ENCOUNTER — Encounter: Payer: Self-pay | Admitting: Family Medicine

## 2021-12-29 LAB — COLOGUARD: Cologuard: NEGATIVE

## 2022-01-15 ENCOUNTER — Other Ambulatory Visit (HOSPITAL_COMMUNITY): Payer: Self-pay

## 2022-01-17 ENCOUNTER — Encounter (HOSPITAL_COMMUNITY): Payer: Self-pay | Admitting: Pharmacist

## 2022-01-17 ENCOUNTER — Other Ambulatory Visit (HOSPITAL_COMMUNITY): Payer: Self-pay

## 2022-01-18 ENCOUNTER — Other Ambulatory Visit (HOSPITAL_COMMUNITY): Payer: Self-pay

## 2022-02-05 ENCOUNTER — Other Ambulatory Visit (INDEPENDENT_AMBULATORY_CARE_PROVIDER_SITE_OTHER): Payer: 59

## 2022-02-05 DIAGNOSIS — E785 Hyperlipidemia, unspecified: Secondary | ICD-10-CM

## 2022-02-05 LAB — HEPATIC FUNCTION PANEL
ALT: 24 U/L (ref 0–53)
AST: 16 U/L (ref 0–37)
Albumin: 4.1 g/dL (ref 3.5–5.2)
Alkaline Phosphatase: 67 U/L (ref 39–117)
Bilirubin, Direct: 0.1 mg/dL (ref 0.0–0.3)
Total Bilirubin: 0.7 mg/dL (ref 0.2–1.2)
Total Protein: 6.1 g/dL (ref 6.0–8.3)

## 2022-02-05 LAB — LIPID PANEL
Cholesterol: 131 mg/dL (ref 0–200)
HDL: 38.6 mg/dL — ABNORMAL LOW (ref >39.00)
LDL Cholesterol: 76 mg/dL (ref 0–99)
NonHDL: 92.26
Total CHOL/HDL Ratio: 3
Triglycerides: 82 mg/dL (ref 0.0–149.0)
VLDL: 16.4 mg/dL (ref 0.0–40.0)

## 2022-02-14 ENCOUNTER — Other Ambulatory Visit (HOSPITAL_COMMUNITY): Payer: Self-pay

## 2022-03-20 ENCOUNTER — Other Ambulatory Visit (HOSPITAL_COMMUNITY): Payer: Self-pay

## 2022-04-12 ENCOUNTER — Other Ambulatory Visit: Payer: Self-pay | Admitting: Family Medicine

## 2022-04-12 ENCOUNTER — Other Ambulatory Visit (HOSPITAL_COMMUNITY): Payer: Self-pay

## 2022-04-12 MED ORDER — ATORVASTATIN CALCIUM 40 MG PO TABS
40.0000 mg | ORAL_TABLET | Freq: Every day | ORAL | 3 refills | Status: DC
  Filled 2022-04-12: qty 30, 30d supply, fill #0
  Filled 2022-04-16: qty 90, 90d supply, fill #0
  Filled 2022-07-12: qty 30, 30d supply, fill #1

## 2022-04-16 ENCOUNTER — Other Ambulatory Visit (HOSPITAL_COMMUNITY): Payer: Self-pay

## 2022-07-12 ENCOUNTER — Other Ambulatory Visit (HOSPITAL_COMMUNITY): Payer: Self-pay

## 2022-08-15 ENCOUNTER — Other Ambulatory Visit (HOSPITAL_COMMUNITY): Payer: Self-pay

## 2022-08-15 ENCOUNTER — Other Ambulatory Visit: Payer: Self-pay

## 2022-08-15 ENCOUNTER — Other Ambulatory Visit: Payer: Self-pay | Admitting: Family Medicine

## 2022-08-15 MED ORDER — ATORVASTATIN CALCIUM 40 MG PO TABS
40.0000 mg | ORAL_TABLET | Freq: Every day | ORAL | 3 refills | Status: DC
  Filled 2022-08-15: qty 30, 30d supply, fill #0
  Filled 2022-09-10 – 2023-01-07 (×2): qty 30, 30d supply, fill #1
  Filled 2023-02-04: qty 30, 30d supply, fill #2
  Filled 2023-03-04: qty 30, 30d supply, fill #3

## 2022-08-20 ENCOUNTER — Emergency Department: Payer: Commercial Managed Care - PPO

## 2022-08-20 ENCOUNTER — Emergency Department
Admission: EM | Admit: 2022-08-20 | Discharge: 2022-08-20 | Disposition: A | Discharge status: Home / Self Care | Payer: Commercial Managed Care - PPO | Attending: Emergency Medicine | Admitting: Emergency Medicine

## 2022-08-20 ENCOUNTER — Other Ambulatory Visit: Payer: Self-pay

## 2022-08-20 DIAGNOSIS — D72829 Elevated white blood cell count, unspecified: Secondary | ICD-10-CM | POA: Diagnosis not present

## 2022-08-20 DIAGNOSIS — R112 Nausea with vomiting, unspecified: Secondary | ICD-10-CM

## 2022-08-20 DIAGNOSIS — N202 Calculus of kidney with calculus of ureter: Secondary | ICD-10-CM | POA: Insufficient documentation

## 2022-08-20 DIAGNOSIS — R109 Unspecified abdominal pain: Secondary | ICD-10-CM | POA: Diagnosis present

## 2022-08-20 DIAGNOSIS — N179 Acute kidney failure, unspecified: Secondary | ICD-10-CM | POA: Diagnosis not present

## 2022-08-20 DIAGNOSIS — N132 Hydronephrosis with renal and ureteral calculous obstruction: Secondary | ICD-10-CM | POA: Diagnosis not present

## 2022-08-20 DIAGNOSIS — N200 Calculus of kidney: Secondary | ICD-10-CM

## 2022-08-20 LAB — URINALYSIS, W/ REFLEX TO CULTURE (INFECTION SUSPECTED)
Bacteria, UA: NONE SEEN
Bilirubin Urine: NEGATIVE
Glucose, UA: NEGATIVE mg/dL
Hgb urine dipstick: NEGATIVE
Ketones, ur: 5 mg/dL — AB
Leukocytes,Ua: NEGATIVE
Nitrite: NEGATIVE
Protein, ur: 30 mg/dL — AB
Specific Gravity, Urine: 1.028 (ref 1.005–1.030)
pH: 7 (ref 5.0–8.0)

## 2022-08-20 LAB — CBC WITH DIFFERENTIAL/PLATELET
Abs Immature Granulocytes: 0.09 10*3/uL — ABNORMAL HIGH (ref 0.00–0.07)
Basophils Absolute: 0.1 10*3/uL (ref 0.0–0.1)
Basophils Relative: 0 %
Eosinophils Absolute: 0 10*3/uL (ref 0.0–0.5)
Eosinophils Relative: 0 %
HCT: 44.5 % (ref 39.0–52.0)
Hemoglobin: 15.4 g/dL (ref 13.0–17.0)
Immature Granulocytes: 1 %
Lymphocytes Relative: 8 %
Lymphs Abs: 1.3 10*3/uL (ref 0.7–4.0)
MCH: 30.1 pg (ref 26.0–34.0)
MCHC: 34.6 g/dL (ref 30.0–36.0)
MCV: 87.1 fL (ref 80.0–100.0)
Monocytes Absolute: 0.8 10*3/uL (ref 0.1–1.0)
Monocytes Relative: 5 %
Neutro Abs: 13.7 10*3/uL — ABNORMAL HIGH (ref 1.7–7.7)
Neutrophils Relative %: 86 %
Platelets: 277 10*3/uL (ref 150–400)
RBC: 5.11 MIL/uL (ref 4.22–5.81)
RDW: 11.7 % (ref 11.5–15.5)
WBC: 16 10*3/uL — ABNORMAL HIGH (ref 4.0–10.5)
nRBC: 0 % (ref 0.0–0.2)

## 2022-08-20 LAB — BASIC METABOLIC PANEL
Anion gap: 10 (ref 5–15)
BUN: 18 mg/dL (ref 6–20)
CO2: 21 mmol/L — ABNORMAL LOW (ref 22–32)
Calcium: 9.1 mg/dL (ref 8.9–10.3)
Chloride: 104 mmol/L (ref 98–111)
Creatinine, Ser: 1.45 mg/dL — ABNORMAL HIGH (ref 0.61–1.24)
GFR, Estimated: 59 mL/min — ABNORMAL LOW (ref >60)
Glucose, Bld: 144 mg/dL — ABNORMAL HIGH (ref 70–99)
Potassium: 3.7 mmol/L (ref 3.5–5.1)
Sodium: 135 mmol/L (ref 135–145)

## 2022-08-20 MED ORDER — KETOROLAC TROMETHAMINE 30 MG/ML IJ SOLN
15.0000 mg | Freq: Once | INTRAMUSCULAR | Status: AC
  Administered 2022-08-20: 15 mg via INTRAVENOUS
  Filled 2022-08-20: qty 1

## 2022-08-20 MED ORDER — ONDANSETRON HCL 4 MG PO TABS
4.0000 mg | ORAL_TABLET | Freq: Four times a day (QID) | ORAL | 0 refills | Status: AC | PRN
  Filled 2022-08-20: qty 20, 5d supply, fill #0

## 2022-08-20 MED ORDER — MORPHINE SULFATE (PF) 4 MG/ML IV SOLN
4.0000 mg | Freq: Once | INTRAVENOUS | Status: AC
  Administered 2022-08-20: 4 mg via INTRAVENOUS
  Filled 2022-08-20: qty 1

## 2022-08-20 MED ORDER — TAMSULOSIN HCL 0.4 MG PO CAPS
0.4000 mg | ORAL_CAPSULE | Freq: Every day | ORAL | 0 refills | Status: AC
  Filled 2022-08-20: qty 14, 14d supply, fill #0

## 2022-08-20 MED ORDER — HYDROCODONE-ACETAMINOPHEN 5-325 MG PO TABS
1.0000 | ORAL_TABLET | Freq: Four times a day (QID) | ORAL | 0 refills | Status: AC | PRN
  Filled 2022-08-20: qty 12, 3d supply, fill #0

## 2022-08-20 MED ORDER — LACTATED RINGERS IV BOLUS
1000.0000 mL | Freq: Once | INTRAVENOUS | Status: AC
  Administered 2022-08-20: 1000 mL via INTRAVENOUS

## 2022-08-20 MED ORDER — ONDANSETRON HCL 4 MG/2ML IJ SOLN
4.0000 mg | INTRAMUSCULAR | Status: AC
  Administered 2022-08-20: 4 mg via INTRAVENOUS
  Filled 2022-08-20: qty 2

## 2022-08-20 NOTE — ED Provider Notes (Signed)
[facility] Provider Note    Event Date/Time   First MD Initiated Contact with Patient 08/20/22 [phone]     (approximate)   History   Flank Pain   HPI Jerry Rowland is a 47 y.o. male has had kidney stones in the past and presents this morning with acute onset pain in the right side of his abdomen and back (mostly the flank).  It started around 9 to 10 PM tonight and has been constant for the last few hours, waxing and waning in intensity but never going away.  Accompanied with nausea and several episodes of vomiting.  Feels similar to prior kidney stones.  He said that he has had episodes where he just passes him and does not have to come in but this pain is quite severe.  Nothing in particular seems to make it better or worse.  He felt fine prior to the onset of the pain and has not been feeling ill recently.     Physical Exam   Triage Vital Signs: ED Triage Vitals  Enc Vitals Group     BP 08/20/22 0603 (!) 158/85     Pulse Rate 08/20/22 0603 79     Resp 08/20/22 0601 19     Temp 08/20/22 0603 97.7 F (36.5 C)     Temp Source 08/20/22 0603 Oral     SpO2 08/20/22 0603 100 %     Weight 08/20/22 0601 81.6 kg (180 lb)     Height 08/20/22 0601 1.727 m (5\' 8" )     Head Circumference --      Peak Flow --      Pain Score 08/20/22 0601 10     Pain Loc --      Pain Edu? --      Excl. in GC? --     Most recent vital signs: Vitals:   08/20/22 0601 08/20/22 0603  BP:  (!) 158/85  Pulse:  79  Resp: 19   Temp:  97.7 F (36.5 C)  SpO2:  100%    General: Awake, alert, patient appears to be in pain and nauseated, had to run to the trash can to vomit during my assessment.  Cannot find a position of comfort. CV:  Good peripheral perfusion.  Regular rate and rhythm. Resp:  Normal effort. Speaking easily and comfortably, no accessory muscle usage nor intercostal retractions.   Abd:  No distention.  No tenderness to palpation of the abdomen.  Moderate to  severe tenderness to right flank percussion.   ED Results / Procedures / Treatments   Labs (all labs ordered are listed, but only abnormal results are displayed) Labs Reviewed  URINALYSIS, W/ REFLEX TO CULTURE (INFECTION SUSPECTED) - Abnormal; Notable for the following components:      Result Value   Color, Urine YELLOW (*)    APPearance CLOUDY (*)    Ketones, ur 5 (*)    Protein, ur 30 (*)    All other components within normal limits  CBC WITH DIFFERENTIAL/PLATELET - Abnormal; Notable for the following components:   WBC 16.0 (*)    Neutro Abs 13.7 (*)    Abs Immature Granulocytes 0.09 (*)    All other components within normal limits  BASIC METABOLIC PANEL - Abnormal; Notable for the following components:   CO2 21 (*)    Glucose, Bld 144 (*)    Creatinine, Ser 1.45 (*)    GFR, Estimated 59 (*)    All other components within  normal limits     RADIOLOGY See hospital course for details: CT renal stone protocol pending at time of transfer of care.   PROCEDURES:  Critical Care performed: No  Procedures    IMPRESSION / MDM / ASSESSMENT AND PLAN / ED COURSE  I reviewed the triage vital signs and the nursing notes.                              Differential diagnosis includes, but is not limited to, renal/ureteral colic, UTI/pyelonephritis, appendicitis, less likely AAS.  Patient's presentation is most consistent with acute presentation with potential threat to life or bodily function.  Labs/studies ordered: BMP, CBC with differential, urinalysis, CT renal stone study  Interventions/Medications given:  Medications  ketorolac (TORADOL) 30 MG/ML injection 15 mg (15 mg Intravenous Given 08/20/22 0639)  ondansetron (ZOFRAN) injection 4 mg (4 mg Intravenous Given 08/20/22 8295)  morphine (PF) 4 MG/ML injection 4 mg (4 mg Intravenous Given 08/20/22 0639)  lactated ringers bolus 1,000 mL (1,000 mLs Intravenous New Bag/Given 08/20/22 0706)    (Note:  hospital course my include  additional interventions and/or labs/studies not listed above.)   Urinalysis results are generally reassuring, cloudy but not indicative of acute infection and no obvious hematuria.  CBC and BMP are pending.  Analgesia and antiemetics as described above have been ordered.   Clinical Course as of 08/20/22 0730  Mon Aug 20, 2022  6213 Patient's labs are notable for an acute kidney injury with a creatinine of about 1.4.  CT scan radiology report is pending but I personally viewed and interpreted it and can see what I believe is a right-sided ureteral stone.  Awaiting official CT results.  I ordered a liter of LR IV bolus for his acute kidney injury and I am transferring ED care to Dr. Rosalia Hammers to follow-up on the results and reassess the patient. [CF]  0705 The patient has a leukocytosis of 16 but I suspect this is only due to pain and stress reaction rather than being reflective of acute infection. [CF]    Clinical Course User Index [CF] Loleta Rose, MD     FINAL CLINICAL IMPRESSION(S) / ED DIAGNOSES   Final diagnoses:  Acute kidney injury (HCC)  Nausea and vomiting, unspecified vomiting type  Acute right flank pain     Rx / DC Orders   ED Discharge Orders     None        Note:  This document was prepared using Dragon voice recognition software and may include unintentional dictation errors.   Loleta Rose, MD 08/20/22 0730

## 2022-08-20 NOTE — ED Triage Notes (Signed)
Pt arrives via POV with CC of R flank pain x8 hours. Hx ok kidney stones - states feels same. Collecting urine sample at this time.

## 2022-08-20 NOTE — Discharge Instructions (Signed)
You were seen in the emergency department today for evaluation of your flank pain.  Your CT scan did show that you have a kidney stone that I suspect is the likely cause of your pain.  Fortunately your urine did not look infected.  You can take Tylenol and ibuprofen to help with your pain.  If you have breakthrough pain, I have sent a short course of narcotic pain medicine to your pharmacy.  This can make you drowsy, so do not drive or operate machinery when taking this.  In addition, I have sent a nausea medicine as well as a medicine that can help pass your stone called Flomax.  Follow-up with urologist in the next 1 to 2 weeks for reevaluation.  Return to the ER for new or worsening symptoms including uncontrolled pain despite pain medication, inability to tolerate food or liquids, fevers, or any other new or concerning symptoms.

## 2022-08-20 NOTE — ED Provider Notes (Signed)
Care of this patient assumed from prior physician at 0700 pending CT, reevaluation, and disposition.  Please see initial physician note for further details.  Briefly, this is a 47 year old male with history of nephrolithiasis who presented with acute onset right-sided back and flank pain with associated nausea and vomiting.  His lab work demonstrated leukocytosis with WC of 16, normal hemoglobin.  Mild renal impairment with creatinine of 1.45, baseline around 1.  His urine is without evidence of infection.  Signed out to me pending CT which did demonstrate a obstructing 4 mm stone at the right UVJ consistent with location of patient's pain.  He was ordered for Toradol, morphine, Zofran, and fluids.  On reevaluation, patient reports significant improvement in his symptoms.  He did feel that his symptoms were adequately controlled that he could be safely discharged home.  Will DC with prescriptions for Flomax, Zofran, Norco, and provide information for follow-up with urology.   Trinna Post, MD 08/20/22 [phone]

## 2022-08-21 ENCOUNTER — Other Ambulatory Visit: Payer: Self-pay

## 2022-09-10 ENCOUNTER — Encounter: Payer: Self-pay | Admitting: Pharmacist

## 2022-09-10 ENCOUNTER — Other Ambulatory Visit: Payer: Self-pay

## 2022-09-13 ENCOUNTER — Other Ambulatory Visit: Payer: Self-pay

## 2022-12-14 ENCOUNTER — Encounter: Payer: Self-pay | Admitting: Family Medicine

## 2022-12-14 ENCOUNTER — Ambulatory Visit (INDEPENDENT_AMBULATORY_CARE_PROVIDER_SITE_OTHER): Payer: Managed Care, Other (non HMO) | Admitting: Family Medicine

## 2022-12-14 VITALS — BP 132/84 | HR 78 | Temp 98.2°F | Ht 68.5 in | Wt 172.2 lb

## 2022-12-14 DIAGNOSIS — Z0000 Encounter for general adult medical examination without abnormal findings: Secondary | ICD-10-CM | POA: Diagnosis not present

## 2022-12-14 LAB — COMPREHENSIVE METABOLIC PANEL
ALT: 31 U/L (ref 0–53)
AST: 16 U/L (ref 0–37)
Albumin: 4.4 g/dL (ref 3.5–5.2)
Alkaline Phosphatase: 79 U/L (ref 39–117)
BUN: 14 mg/dL (ref 6–23)
CO2: 30 mEq/L (ref 19–32)
Calcium: 9.4 mg/dL (ref 8.4–10.5)
Chloride: 102 mEq/L (ref 96–112)
Creatinine, Ser: 0.92 mg/dL (ref 0.40–1.50)
GFR: 97.96 mL/min (ref >60.00)
Glucose, Bld: 93 mg/dL (ref 70–99)
Potassium: 3.9 mEq/L (ref 3.5–5.1)
Sodium: 140 mEq/L (ref 135–145)
Total Bilirubin: 1 mg/dL (ref 0.2–1.2)
Total Protein: 6.7 g/dL (ref 6.0–8.3)

## 2022-12-14 LAB — LIPID PANEL
Cholesterol: 171 mg/dL (ref 0–200)
HDL: 47.1 mg/dL (ref >39.00)
LDL Cholesterol: 103 mg/dL — ABNORMAL HIGH (ref 0–99)
NonHDL: 124.37
Total CHOL/HDL Ratio: 4
Triglycerides: 105 mg/dL (ref 0.0–149.0)
VLDL: 21 mg/dL (ref 0.0–40.0)

## 2022-12-14 LAB — CBC
HCT: 45.6 % (ref 39.0–52.0)
Hemoglobin: 15.2 g/dL (ref 13.0–17.0)
MCHC: 33.4 g/dL (ref 30.0–36.0)
MCV: 90.3 fl (ref 78.0–100.0)
Platelets: 257 10*3/uL (ref 150.0–400.0)
RBC: 5.04 Mil/uL (ref 4.22–5.81)
RDW: 13 % (ref 11.5–15.5)
WBC: 5.5 10*3/uL (ref 4.0–10.5)

## 2022-12-14 LAB — COLOGUARD: Cologuard: NEGATIVE

## 2022-12-14 NOTE — Progress Notes (Signed)
CC: Physical  Well Male Jerry Rowland is here for a complete physical.   His last physical was >1 year ago.  Current diet: in general, a "healthy" diet.   Current exercise: active with lacrosse and soccer Weight trend: stable Fatigue out of ordinary? No. Seat belt? Yes.   Advanced directive? Yes  Health maintenance Tetanus- Yes HIV- Yes Hep C- Yes  Past Medical History:  Diagnosis Date   Nephrolithiasis      Past Surgical History:  Procedure Laterality Date   ANKLE FRACTURE SURGERY Right    MOUTH SURGERY     Implants   WISDOM TOOTH EXTRACTION      Medications  Current Outpatient Medications on File Prior to Visit  Medication Sig Dispense Refill   atorvastatin (LIPITOR) 40 MG tablet Take 1 tablet (40 mg total) by mouth daily. 30 tablet 3   No current facility-administered medications on file prior to visit.     Allergies Allergies  Allergen Reactions   Codeine Itching    Family History Family History  Problem Relation Age of Onset   Prostate cancer Neg Hx    Bladder Cancer Neg Hx    Kidney cancer Neg Hx     Review of Systems: Constitutional: no fevers or chills Eye:  no recent significant change in vision Ear/Nose/Mouth/Throat:  Ears:  no hearing loss Nose/Mouth/Throat:  no complaints of nasal congestion, no sore throat Cardiovascular:  no chest pain Respiratory:  no shortness of breath Gastrointestinal:  no abdominal pain, no change in bowel habits GU:  Male: negative for dysuria, frequency, and incontinence Musculoskeletal/Extremities:  no pain of the joints Integumentary (Skin/Breast):  no abnormal skin lesions reported Neurologic:  no headaches Endocrine: No unexpected weight changes Hematologic/Lymphatic:  no night sweats  Exam BP 132/84 (BP Location: Left Arm, Patient Position: Sitting, Cuff Size: Normal)   Pulse 78   Temp 98.2 F (36.8 C) (Oral)   Ht 5' 8.5" (1.74 m)   Wt 172 lb 4 oz (78.1 kg)   SpO2 98%   BMI 25.81 kg/m  General:   well developed, well nourished, in no apparent distress Skin:  no significant moles, warts, or growths Head:  no masses, lesions, or tenderness Eyes:  pupils equal and round, sclera anicteric without injection Ears:  canals without lesions, TMs shiny without retraction, no obvious effusion, no erythema Nose:  nares patent, mucosa normal Throat/Pharynx:  lips and gingiva without lesion; tongue and uvula midline; non-inflamed pharynx; no exudates or postnasal drainage Neck: neck supple without adenopathy, thyromegaly, or masses Lungs:  clear to auscultation, breath sounds equal bilaterally, no respiratory distress Cardio:  regular rate and rhythm, no bruits, no LE edema Abdomen:  abdomen soft, nontender; bowel sounds normal; no masses or organomegaly Rectal: Deferred Musculoskeletal:  symmetrical muscle groups noted without atrophy or deformity Extremities:  no clubbing, cyanosis, or edema, no deformities, no skin discoloration Neuro:  gait normal; deep tendon reflexes normal and symmetric Psych: well oriented with normal range of affect and appropriate judgment/insight  Assessment and Plan  Well adult exam - Plan: CBC, Comprehensive metabolic panel, Lipid panel   Well 47 y.o. male. Counseled on diet and exercise. Counseled on risks and benefits of prostate cancer screening with PSA. The patient agrees to forego screening.  Advanced directive form requested today.  Other orders as above. Follow up in 1 yr pending the above workup. The patient voiced understanding and agreement to the plan.  Jilda Roche [place], DO 12/14/22 1:25 PM

## 2022-12-14 NOTE — Patient Instructions (Addendum)
Give [place] 2-3 business days to get the results of your labs back.   Keep the diet clean and stay active.  Please get me a copy of your advanced directive form at your convenience.   Let us know if you need anything.

## 2023-01-07 ENCOUNTER — Other Ambulatory Visit (HOSPITAL_COMMUNITY): Payer: Self-pay

## 2023-02-04 ENCOUNTER — Other Ambulatory Visit: Payer: Self-pay

## 2023-03-04 ENCOUNTER — Other Ambulatory Visit: Payer: Self-pay

## 2023-04-01 ENCOUNTER — Other Ambulatory Visit (HOSPITAL_COMMUNITY): Payer: Self-pay

## 2023-04-01 ENCOUNTER — Other Ambulatory Visit: Payer: Self-pay

## 2023-04-01 ENCOUNTER — Other Ambulatory Visit: Payer: Self-pay | Admitting: Family Medicine

## 2023-04-01 MED ORDER — ATORVASTATIN CALCIUM 40 MG PO TABS
40.0000 mg | ORAL_TABLET | Freq: Every day | ORAL | 2 refills | Status: AC
  Filled 2023-04-01 – 2023-04-05 (×2): qty 90, 90d supply, fill #0
  Filled 2023-10-08 – 2023-10-23 (×2): qty 90, 90d supply, fill #1

## 2023-04-02 ENCOUNTER — Encounter: Payer: Self-pay | Admitting: Pharmacist

## 2023-04-02 ENCOUNTER — Other Ambulatory Visit: Payer: Self-pay

## 2023-04-04 ENCOUNTER — Other Ambulatory Visit: Payer: Self-pay

## 2023-04-05 ENCOUNTER — Other Ambulatory Visit: Payer: Self-pay

## 2023-04-05 ENCOUNTER — Other Ambulatory Visit (HOSPITAL_COMMUNITY): Payer: Self-pay

## 2023-10-08 ENCOUNTER — Other Ambulatory Visit (HOSPITAL_COMMUNITY): Payer: Self-pay

## 2023-10-09 ENCOUNTER — Other Ambulatory Visit: Payer: Self-pay

## 2023-10-14 ENCOUNTER — Other Ambulatory Visit: Payer: Self-pay

## 2023-10-15 ENCOUNTER — Encounter: Payer: Self-pay | Admitting: Family Medicine

## 2023-10-15 NOTE — Telephone Encounter (Signed)
 Patient called appointment scheduled for 10/21/23

## 2023-10-16 ENCOUNTER — Other Ambulatory Visit (HOSPITAL_COMMUNITY): Payer: Self-pay

## 2023-10-21 ENCOUNTER — Encounter: Payer: Self-pay | Admitting: Family Medicine

## 2023-10-21 ENCOUNTER — Ambulatory Visit: Admitting: Family Medicine

## 2023-10-21 VITALS — BP 110/68 | HR 88 | Temp 97.8°F | Resp 16 | Ht 68.0 in | Wt 176.2 lb

## 2023-10-21 DIAGNOSIS — R319 Hematuria, unspecified: Secondary | ICD-10-CM | POA: Diagnosis not present

## 2023-10-21 LAB — POC URINALSYSI DIPSTICK (AUTOMATED)
Blood, UA: NEGATIVE
Glucose, UA: NEGATIVE
Ketones, UA: NEGATIVE
Leukocytes, UA: NEGATIVE
Nitrite, UA: NEGATIVE
Protein, UA: NEGATIVE
Spec Grav, UA: >=1.03 — AB (ref 1.010–1.025)
Urobilinogen, UA: 0.2 U/dL
pH, UA: 5 (ref 5.0–8.0)

## 2023-10-21 NOTE — Progress Notes (Signed)
 Chief Complaint  Patient presents with   Hematuria    Blood in Urine    Jerry Rowland is a 48 y.o. male here for dark urine.  Duration: 9 days. Cleared up after 1/2 day. Symptoms: blood mixed in the urine Denies: urinary frequency, hematuria, urinary hesitancy, urinary retention, fever, nausea, vomiting, urgency, trauma, discharge Hx of kidney stones? Yes  Past Medical History:  Diagnosis Date   Nephrolithiasis      BP 110/68 (BP Location: Left Arm, Patient Position: Sitting)   Pulse 88   Temp 97.8 F (36.6 C) (Other (Comment))   Resp 16   Ht 5' 8 (1.727 m)   Wt 176 lb 3.2 oz (79.9 kg)   SpO2 98%   BMI 26.79 kg/m  General: Awake, alert, appears stated age Heart: RRR Lungs: CTAB, normal respiratory effort, no accessory muscle usage Abd: BS+, soft, NT, ND, no masses or organomegaly MSK: No CVA tenderness, neg Lloyd's sign Psych: Age appropriate judgment and insight  Hematuria, unspecified type   Possible gross hematuria?  No obvious signs of infection.  He does have a history of kidney stones which could be contributing.  Some of the stones pass without any pain.  Check urine today. Stay hydrated. Seek immediate care if he starts to develop fevers, new/worsening symptoms, uncontrollable N/V. F/u prn. The patient voiced understanding and agreement to the plan.  Mabel [place], DO 10/21/23 8:35 AM

## 2023-10-21 NOTE — Patient Instructions (Signed)
 We will be in touch regarding your urine results and proceed accordingly.   Stay hydrated.   Let us  know if you need anything.

## 2023-10-23 ENCOUNTER — Other Ambulatory Visit (HOSPITAL_COMMUNITY): Payer: Self-pay
